# Patient Record
Sex: Male | Born: 1965 | Race: White | Hispanic: No | Marital: Married | Smoking: Current every day smoker
Health system: Southern US, Community
[De-identification: ages and names within clinical notes are randomized; demographics above are authoritative.]

## PROBLEM LIST (undated history)

## (undated) DIAGNOSIS — I509 Heart failure, unspecified: Secondary | ICD-10-CM

## (undated) DIAGNOSIS — N289 Disorder of kidney and ureter, unspecified: Secondary | ICD-10-CM

## (undated) DIAGNOSIS — F319 Bipolar disorder, unspecified: Secondary | ICD-10-CM

## (undated) DIAGNOSIS — M5030 Other cervical disc degeneration, unspecified cervical region: Secondary | ICD-10-CM

## (undated) DIAGNOSIS — K219 Gastro-esophageal reflux disease without esophagitis: Secondary | ICD-10-CM

## (undated) DIAGNOSIS — K611 Rectal abscess: Secondary | ICD-10-CM

## (undated) DIAGNOSIS — F329 Major depressive disorder, single episode, unspecified: Secondary | ICD-10-CM

## (undated) DIAGNOSIS — E7800 Pure hypercholesterolemia, unspecified: Secondary | ICD-10-CM

## (undated) DIAGNOSIS — E785 Hyperlipidemia, unspecified: Secondary | ICD-10-CM

## (undated) DIAGNOSIS — G20 Parkinson's disease: Secondary | ICD-10-CM

## (undated) DIAGNOSIS — F32A Depression, unspecified: Secondary | ICD-10-CM

## (undated) DIAGNOSIS — I10 Essential (primary) hypertension: Secondary | ICD-10-CM

## (undated) DIAGNOSIS — E119 Type 2 diabetes mellitus without complications: Secondary | ICD-10-CM

## (undated) HISTORY — DX: Rectal abscess: K61.1

## (undated) HISTORY — DX: Pure hypercholesterolemia, unspecified: E78.00

## (undated) HISTORY — DX: Gastro-esophageal reflux disease without esophagitis: K21.9

## (undated) HISTORY — DX: Bipolar disorder, unspecified: F31.9

## (undated) HISTORY — PX: NECK SURGERY: SHX720

## (undated) HISTORY — DX: Major depressive disorder, single episode, unspecified: F32.9

## (undated) HISTORY — DX: Parkinson's disease: G20

## (undated) HISTORY — DX: Other cervical disc degeneration, unspecified cervical region: M50.30

## (undated) HISTORY — DX: Depression, unspecified: F32.A

---
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
## 2001-07-09 ENCOUNTER — Inpatient Hospital Stay (HOSPITAL_COMMUNITY): Admission: AD | Admit: 2001-07-09 | Discharge: 2001-07-14 | Payer: Self-pay | Admitting: Psychiatry

## 2002-06-01 ENCOUNTER — Encounter: Payer: Self-pay | Admitting: Internal Medicine

## 2002-06-01 ENCOUNTER — Ambulatory Visit (HOSPITAL_COMMUNITY): Admission: RE | Admit: 2002-06-01 | Discharge: 2002-06-01 | Payer: Self-pay | Admitting: Internal Medicine

## 2002-07-14 ENCOUNTER — Observation Stay (HOSPITAL_COMMUNITY): Admission: AD | Admit: 2002-07-14 | Discharge: 2002-07-15 | Payer: Self-pay | Admitting: Neurosurgery

## 2002-07-14 ENCOUNTER — Encounter: Payer: Self-pay | Admitting: Neurosurgery

## 2002-08-13 ENCOUNTER — Encounter: Payer: Self-pay | Admitting: Emergency Medicine

## 2002-08-13 ENCOUNTER — Inpatient Hospital Stay (HOSPITAL_COMMUNITY): Admission: EM | Admit: 2002-08-13 | Discharge: 2002-08-19 | Payer: Self-pay | Admitting: Psychiatry

## 2003-06-05 ENCOUNTER — Emergency Department (HOSPITAL_COMMUNITY): Admission: EM | Admit: 2003-06-05 | Discharge: 2003-06-05 | Payer: Self-pay | Admitting: Emergency Medicine

## 2003-06-29 ENCOUNTER — Emergency Department (HOSPITAL_COMMUNITY): Admission: EM | Admit: 2003-06-29 | Discharge: 2003-06-29 | Payer: Self-pay | Admitting: Emergency Medicine

## 2008-08-31 DIAGNOSIS — G20A1 Parkinson's disease without dyskinesia, without mention of fluctuations: Secondary | ICD-10-CM

## 2008-08-31 DIAGNOSIS — G20 Parkinson's disease: Secondary | ICD-10-CM

## 2008-08-31 HISTORY — DX: Parkinson's disease: G20

## 2008-08-31 HISTORY — DX: Parkinson's disease without dyskinesia, without mention of fluctuations: G20.A1

## 2008-08-31 HISTORY — PX: CARPAL TUNNEL RELEASE: SHX101

## 2015-02-09 ENCOUNTER — Encounter (HOSPITAL_COMMUNITY): Payer: Self-pay | Admitting: Emergency Medicine

## 2015-02-09 ENCOUNTER — Emergency Department (HOSPITAL_COMMUNITY)
Admission: EM | Admit: 2015-02-09 | Discharge: 2015-02-09 | Disposition: A | Discharge status: Home / Self Care | Payer: Medicare Other | Attending: Emergency Medicine | Admitting: Emergency Medicine

## 2015-02-09 ENCOUNTER — Emergency Department (HOSPITAL_COMMUNITY): Payer: Medicare Other

## 2015-02-09 DIAGNOSIS — E119 Type 2 diabetes mellitus without complications: Secondary | ICD-10-CM | POA: Diagnosis not present

## 2015-02-09 DIAGNOSIS — R0600 Dyspnea, unspecified: Secondary | ICD-10-CM | POA: Insufficient documentation

## 2015-02-09 DIAGNOSIS — E785 Hyperlipidemia, unspecified: Secondary | ICD-10-CM | POA: Diagnosis not present

## 2015-02-09 DIAGNOSIS — Z720 Tobacco use: Secondary | ICD-10-CM | POA: Diagnosis not present

## 2015-02-09 DIAGNOSIS — I509 Heart failure, unspecified: Secondary | ICD-10-CM | POA: Insufficient documentation

## 2015-02-09 DIAGNOSIS — Z792 Long term (current) use of antibiotics: Secondary | ICD-10-CM | POA: Insufficient documentation

## 2015-02-09 DIAGNOSIS — Z79899 Other long term (current) drug therapy: Secondary | ICD-10-CM | POA: Insufficient documentation

## 2015-02-09 DIAGNOSIS — R600 Localized edema: Secondary | ICD-10-CM | POA: Diagnosis not present

## 2015-02-09 DIAGNOSIS — R0602 Shortness of breath: Secondary | ICD-10-CM | POA: Diagnosis present

## 2015-02-09 DIAGNOSIS — R609 Edema, unspecified: Secondary | ICD-10-CM

## 2015-02-09 DIAGNOSIS — I10 Essential (primary) hypertension: Secondary | ICD-10-CM | POA: Diagnosis not present

## 2015-02-09 HISTORY — DX: Essential (primary) hypertension: I10

## 2015-02-09 HISTORY — DX: Hyperlipidemia, unspecified: E78.5

## 2015-02-09 HISTORY — DX: Heart failure, unspecified: I50.9

## 2015-02-09 HISTORY — DX: Type 2 diabetes mellitus without complications: E11.9

## 2015-02-09 LAB — BASIC METABOLIC PANEL
Anion gap: 11 (ref 5–15)
BUN: 21 mg/dL — ABNORMAL HIGH (ref 6–20)
CO2: 32 mmol/L (ref 22–32)
Calcium: 8.3 mg/dL — ABNORMAL LOW (ref 8.9–10.3)
Chloride: 86 mmol/L — ABNORMAL LOW (ref 101–111)
Creatinine, Ser: 1.36 mg/dL — ABNORMAL HIGH (ref 0.61–1.24)
GFR calc Af Amer: >60 mL/min (ref >60)
GFR calc non Af Amer: >60 mL/min (ref >60)
Glucose, Bld: 165 mg/dL — ABNORMAL HIGH (ref 65–99)
Potassium: 3.6 mmol/L (ref 3.5–5.1)
Sodium: 129 mmol/L — ABNORMAL LOW (ref 135–145)

## 2015-02-09 LAB — CBC
HCT: 33.5 % — ABNORMAL LOW (ref 39.0–52.0)
Hemoglobin: 11.1 g/dL — ABNORMAL LOW (ref 13.0–17.0)
MCH: 34.9 pg — ABNORMAL HIGH (ref 26.0–34.0)
MCHC: 33.1 g/dL (ref 30.0–36.0)
MCV: 105.3 fL — ABNORMAL HIGH (ref 78.0–100.0)
Platelets: 176 10*3/uL (ref 150–400)
RBC: 3.18 MIL/uL — ABNORMAL LOW (ref 4.22–5.81)
RDW: 12.9 % (ref 11.5–15.5)
WBC: 5 10*3/uL (ref 4.0–10.5)

## 2015-02-09 LAB — I-STAT TROPONIN, ED: Troponin i, poc: 0 ng/mL (ref 0.00–0.08)

## 2015-02-09 LAB — BRAIN NATRIURETIC PEPTIDE: B Natriuretic Peptide: 14.3 pg/mL (ref 0.0–100.0)

## 2015-02-09 MED ORDER — IPRATROPIUM-ALBUTEROL 0.5-2.5 (3) MG/3ML IN SOLN
3.0000 mL | Freq: Once | RESPIRATORY_TRACT | Status: AC
  Administered 2015-02-09: 3 mL via RESPIRATORY_TRACT
  Filled 2015-02-09: qty 3

## 2015-02-09 MED ORDER — FUROSEMIDE 10 MG/ML IJ SOLN
80.0000 mg | Freq: Once | INTRAMUSCULAR | Status: AC
  Administered 2015-02-09: 80 mg via INTRAVENOUS
  Filled 2015-02-09: qty 8

## 2015-02-09 MED ORDER — POTASSIUM CHLORIDE CRYS ER 20 MEQ PO TBCR
60.0000 meq | EXTENDED_RELEASE_TABLET | Freq: Once | ORAL | Status: AC
  Administered 2015-02-09: 60 meq via ORAL
  Filled 2015-02-09: qty 3

## 2015-02-09 MED ORDER — IOHEXOL 350 MG/ML SOLN
100.0000 mL | Freq: Once | INTRAVENOUS | Status: AC | PRN
  Administered 2015-02-09: 100 mL via INTRAVENOUS

## 2015-02-09 MED ORDER — ALBUTEROL SULFATE HFA 108 (90 BASE) MCG/ACT IN AERS
2.0000 | INHALATION_SPRAY | Freq: Once | RESPIRATORY_TRACT | Status: AC
  Administered 2015-02-09: 2 via RESPIRATORY_TRACT
  Filled 2015-02-09: qty 6.7

## 2015-02-09 NOTE — ED Provider Notes (Signed)
CSN: [id number]     Arrival date & time 02/09/15  1903 History   First MD Initiated Contact with Patient 02/09/15 1921     Chief Complaint  Patient presents with  . Chest Pain  . Shortness of Breath     (Consider location/radiation/quality/duration/timing/severity/associated sxs/prior Treatment) HPI  49yM with dyspnea. Worsening over past couple weeks.orthopnea. Worsening LE edema. Reports recently seen by PCP and told may have CHF. Occasional non productive cough. No fever or chills. Also, L lower CP. Onset 4 days ago. Achy. Constant. Does not radiate. No appreciable exacerbating or relieving factors.    Past Medical History  Diagnosis Date  . Hypertension   . Diabetes mellitus without complication   . Hyperlipidemia   . CHF (congestive heart failure)    Past Surgical History  Procedure Laterality Date  . Neck surgery    . Testicle removal Right   . Carpal tunnel release Right    History reviewed. No pertinent family history. History  Substance Use Topics  . Smoking status: Current Every Day Smoker -- 0.50 packs/day    Types: Cigarettes  . Smokeless tobacco: Not on file  . Alcohol Use: No    Review of Systems  All systems reviewed and negative, other than as noted in HPI.   Allergies  Sulfa antibiotics  Home Medications   Prior to Admission medications   Medication Sig Start Date End Date Taking? Authorizing Provider  allopurinol (ZYLOPRIM) 300 MG tablet Take 300 mg by mouth daily at 12 noon. 12/31/14  Yes Historical Provider, MD  carbidopa-levodopa (SINEMET IR) 25-100 MG per tablet Take 2.5 tablets by mouth 3 (three) times daily. 12/31/14  Yes Historical Provider, MD  cefdinir (OMNICEF) 300 MG capsule Take 300 mg by mouth 2 (two) times daily. 02/05/15  Yes Historical Provider, MD  divalproex (DEPAKOTE ER) 500 MG 24 hr tablet Take 1 tablet by mouth 3 (three) times daily. 01/06/15  Yes Historical Provider, MD  fenofibrate (TRICOR) 145 MG tablet Take 145 mg by mouth  daily. 01/14/15  Yes Historical Provider, MD  furosemide (LASIX) 40 MG tablet Take 40 mg by mouth 2 (two) times daily. 01/31/15  Yes Historical Provider, MD  glimepiride (AMARYL) 1 MG tablet Take 1 mg by mouth every morning. 01/14/15  Yes Historical Provider, MD  levothyroxine (SYNTHROID, LEVOTHROID) 50 MCG tablet Take 50 mcg by mouth every morning. 11/29/14  Yes Historical Provider, MD  lisinopril (PRINIVIL,ZESTRIL) 20 MG tablet Take 20 mg by mouth daily. 11/29/14  Yes Historical Provider, MD  morphine (MS CONTIN) 60 MG 12 hr tablet Take 60 mg by mouth 4 (four) times daily. 01/29/15  Yes Historical Provider, MD  omega-3 acid ethyl esters (LOVAZA) 1 G capsule Take 1 capsule by mouth 4 (four) times daily. 02/01/15  Yes Historical Provider, MD  omeprazole (PRILOSEC) 40 MG capsule Take 40 mg by mouth at bedtime. 11/24/14  Yes Historical Provider, MD  Oxycodone HCl 10 MG TABS Take 10 mg by mouth daily as needed. For breakthrough pain 01/29/15  Yes Historical Provider, MD  potassium chloride (MICRO-K) 10 MEQ CR capsule Take 10 mEq by mouth 2 (two) times daily. 02/05/15  Yes Historical Provider, MD  rOPINIRole (REQUIP) 2 MG tablet Take 2 mg by mouth 3 (three) times daily. 11/29/14  Yes Historical Provider, MD  simvastatin (ZOCOR) 40 MG tablet Take 40 mg by mouth at bedtime. 11/29/14  Yes Historical Provider, MD  trihexyphenidyl (ARTANE) 2 MG tablet Take 1 mg by mouth 3 (three) times daily.  11/29/14  Yes Historical Provider, MD   BP 101/70 mmHg  Pulse 89  Temp(Src) 97.8 F (36.6 C) (Oral)  Resp 9  Ht  (1.702 m)  Wt 222 lb 1.6 oz (100.744 kg)  BMI 34.78 kg/m2  SpO2 97% Physical Exam  Constitutional: He appears well-developed and well-nourished. No distress.  HENT:  Head: Normocephalic and atraumatic.  Eyes: Conjunctivae are normal. Right eye exhibits no discharge. Left eye exhibits no discharge.  Neck: Neck supple.  Cardiovascular: Normal rate, regular rhythm and normal heart sounds.  Exam reveals no  gallop and no friction rub.   No murmur heard. Pulmonary/Chest: Effort normal and breath sounds normal. No respiratory distress. He has no wheezes. He has no rales.  Abdominal: Soft. He exhibits no distension. There is no tenderness.  Musculoskeletal: He exhibits edema. He exhibits no tenderness.  Symmetric pitting LE edema  Neurological: He is alert.  Skin: Skin is warm and dry.  Psychiatric: He has a normal mood and affect. His behavior is normal. Thought content normal.  Nursing note and vitals reviewed.   ED Course  Procedures (including critical care time) Labs Review Labs Reviewed  CBC - Abnormal; Notable for the following:    RBC 3.18 (*)    Hemoglobin 11.1 (*)    HCT 33.5 (*)    MCV 105.3 (*)    MCH 34.9 (*)    All other components within normal limits  BASIC METABOLIC PANEL - Abnormal; Notable for the following:    Sodium 129 (*)    Chloride 86 (*)    Glucose, Bld 165 (*)    BUN 21 (*)    Creatinine, Ser 1.36 (*)    Calcium 8.3 (*)    All other components within normal limits  BRAIN NATRIURETIC PEPTIDE  I-STAT TROPOININ, ED    Imaging Review Dg Chest 2 View  02/09/2015   CLINICAL DATA:  Acute onset of dyspnea and left-sided chest pain. Difficulty laying supine. Bilateral lower extremity edema. Initial encounter.  EXAM: CHEST  2 VIEW  COMPARISON:  Chest radiograph performed 02/02/2013  FINDINGS: The lungs are well-aerated and clear. There is no evidence of focal opacification, pleural effusion or pneumothorax.  The heart is normal in size; the mediastinal contour is within normal limits. No acute osseous abnormalities are seen.  IMPRESSION: No acute cardiopulmonary process seen.   Electronically Signed   By: Roanna Raider M.D.   On: 02/09/2015 20:59   Ct Angio Chest Pe W/cm &/or Wo Cm  02/09/2015   CLINICAL DATA:  Subacute onset of left lower chest pain for 2 weeks, with shortness of breath. Initial encounter.  EXAM: CT ANGIOGRAPHY CHEST WITH CONTRAST  TECHNIQUE:  Multidetector CT imaging of the chest was performed using the standard protocol during bolus administration of intravenous contrast. Multiplanar CT image reconstructions and MIPs were obtained to evaluate the vascular anatomy.  CONTRAST:  OMNIPAQUE IOHEXOL 350 MG/ML SOLN  COMPARISON:  Chest radiograph performed earlier today at 8:35 p.m.  FINDINGS: There is no evidence of significant pulmonary embolus.  Minimal left-sided peripheral atelectasis is noted. The lungs are otherwise clear. There is no evidence of significant focal consolidation, pleural effusion or pneumothorax. No masses are identified; no abnormal focal contrast enhancement is seen.  No mediastinal lymphadenopathy is seen. No pericardial effusion is identified. The great vessels are unremarkable in appearance. No axillary lymphadenopathy is seen. The thyroid gland is unremarkable in appearance.  The visualized portions of the liver and spleen are unremarkable.  No  acute osseous abnormalities are seen. Postoperative change is noted along the cervical spine.  Review of the MIP images confirms the above findings.  IMPRESSION: 1. No evidence of significant pulmonary embolus. 2. Minimal left-sided peripheral atelectasis noted. Lungs otherwise clear.   Electronically Signed   By: Roanna Raider M.D.   On: 02/09/2015 22:03     EKG Interpretation   Date/Time:  Saturday February 09 2015 19:13:24 EDT Ventricular Rate:  89 PR Interval:  124 QRS Duration: 92 QT Interval:  354 QTC Calculation: 430 R Axis:   55 Text Interpretation:  Normal sinus rhythm ST \\T \ T wave abnormality  Abnormal ECG similar to previous from 2004 Confirmed by Juleen China  MD, Alaja Goldinger  ([id number]) on 02/09/2015 9:20:57 PM      MDM   Final diagnoses:  Dyspnea        Raeford Razor, MD 02/15/15 1204

## 2015-02-09 NOTE — ED Notes (Signed)
Patient here with left lower chest pain starting about 4 days ago. Recent diagnosis of CHF by PCP on 01/29/2015. Also complaining of SOB and inability to lay flat to sleep.

## 2015-02-09 NOTE — ED Notes (Signed)
Pt stable, ambulatory, states understanding of discharge instructions 

## 2015-02-09 NOTE — Discharge Instructions (Signed)
°Peripheral Edema °You have swelling in your legs (peripheral edema). This swelling is due to excess accumulation of salt and water in your body. Edema may be a sign of heart, kidney or liver disease, or a side effect of a medication. It may also be due to problems in the leg veins. Elevating your legs and using special support stockings may be very helpful, if the cause of the swelling is due to poor venous circulation. Avoid long periods of standing, whatever the cause. °Treatment of edema depends on identifying the cause. Chips, pretzels, pickles and other salty foods should be avoided. Restricting salt in your diet is almost always needed. Water pills (diuretics) are often used to remove the excess salt and water from your body via urine. These medicines prevent the kidney from reabsorbing sodium. This increases urine flow. °Diuretic treatment may also result in lowering of potassium levels in your body. Potassium supplements may be needed if you have to use diuretics daily. Daily weights can help you keep track of your progress in clearing your edema. You should call your caregiver for follow up care as recommended. °SEEK IMMEDIATE MEDICAL CARE IF:  °· You have increased swelling, pain, redness, or heat in your legs. °· You develop shortness of breath, especially when lying down. °· You develop chest or abdominal pain, weakness, or fainting. °· You have a fever. °Document Released: 09/24/2004 Document Revised: 11/09/2011 Document Reviewed: 09/04/2009 °ExitCare® Patient Information ©2015 [employer]. This information is not intended to replace advice given to you by your health care provider. Make sure you discuss any questions you have with your health care provider. ° °Shortness of Breath °Shortness of breath means you have trouble breathing. It could also mean that you have a medical problem. You should get immediate medical care for shortness of breath. °CAUSES  °· Not enough oxygen in the air such as  with high altitudes or a smoke-filled room. °· Certain lung diseases, infections, or problems. °· Heart disease or conditions, such as angina or heart failure. °· Low red blood cells (anemia). °· Poor physical fitness, which can cause shortness of breath when you exercise. °· Chest or back injuries or stiffness. °· Being overweight. °· Smoking. °· Anxiety, which can make you feel like you are not getting enough air. °DIAGNOSIS  °Serious medical problems can often be found during your physical exam. Tests may also be done to determine why you are having shortness of breath. Tests may include: °· Chest X-rays. °· Lung function tests. °· Blood tests. °· An electrocardiogram (ECG). °· An ambulatory electrocardiogram. An ambulatory ECG records your heartbeat patterns over a 24-hour period. °· Exercise testing. °· A transthoracic echocardiogram (TTE). During echocardiography, sound waves are used to evaluate how blood flows through your heart. °· A transesophageal echocardiogram (TEE). °· Imaging scans. °Your health care provider may not be able to find a cause for your shortness of breath after your exam. In this case, it is important to have a follow-up exam with your health care provider as directed.  °TREATMENT  °Treatment for shortness of breath depends on the cause of your symptoms and can vary greatly. °HOME CARE INSTRUCTIONS  °· Do not smoke. Smoking is a common cause of shortness of breath. If you smoke, ask for help to quit. °· Avoid being around chemicals or things that may bother your breathing, such as paint fumes and dust. °· Rest as needed. Slowly resume your usual activities. °· If medicines were prescribed, take them as directed   for the full length of time directed. This includes oxygen and any inhaled medicines. °· Keep all follow-up appointments as directed by your health care provider. °SEEK MEDICAL CARE IF:  °· Your condition does not improve in the time expected. °· You have a hard time doing your  normal activities even with rest. °· You have any new symptoms. °SEEK IMMEDIATE MEDICAL CARE IF:  °· Your shortness of breath gets worse. °· You feel light-headed, faint, or develop a cough not controlled with medicines. °· You start coughing up blood. °· You have pain with breathing. °· You have chest pain or pain in your arms, shoulders, or abdomen. °· You have a fever. °· You are unable to walk up stairs or exercise the way you normally do. °MAKE SURE YOU: °· Understand these instructions. °· Will watch your condition. °· Will get help right away if you are not doing well or get worse. °Document Released: 05/12/2001 Document Revised: 08/22/2013 Document Reviewed: 11/02/2011 °ExitCare® Patient Information ©2015 [employer]. This information is not intended to replace advice given to you by your health care provider. Make sure you discuss any questions you have with your health care provider. ° ° °

## 2015-02-11 DIAGNOSIS — R600 Localized edema: Secondary | ICD-10-CM | POA: Insufficient documentation

## 2015-02-11 DIAGNOSIS — R079 Chest pain, unspecified: Secondary | ICD-10-CM | POA: Insufficient documentation

## 2015-02-12 ENCOUNTER — Ambulatory Visit: Payer: Medicare Other | Admitting: Diagnostic Neuroimaging

## 2015-03-29 ENCOUNTER — Encounter: Payer: Self-pay | Admitting: *Deleted

## 2015-04-02 ENCOUNTER — Encounter: Payer: Self-pay | Admitting: Diagnostic Neuroimaging

## 2015-04-02 ENCOUNTER — Ambulatory Visit (INDEPENDENT_AMBULATORY_CARE_PROVIDER_SITE_OTHER): Payer: Medicare Other | Admitting: Diagnostic Neuroimaging

## 2015-04-02 ENCOUNTER — Telehealth: Payer: Self-pay | Admitting: *Deleted

## 2015-04-02 VITALS — BP 121/71 | HR 80 | Ht 67.0 in | Wt 212.4 lb

## 2015-04-02 DIAGNOSIS — G251 Drug-induced tremor: Secondary | ICD-10-CM | POA: Diagnosis not present

## 2015-04-02 DIAGNOSIS — G20 Parkinson's disease: Secondary | ICD-10-CM | POA: Diagnosis not present

## 2015-04-02 DIAGNOSIS — G252 Other specified forms of tremor: Secondary | ICD-10-CM | POA: Diagnosis not present

## 2015-04-02 DIAGNOSIS — R259 Unspecified abnormal involuntary movements: Secondary | ICD-10-CM

## 2015-04-02 MED ORDER — CARBIDOPA-LEVODOPA 25-100 MG PO TABS: 3.0000 | ORAL_TABLET | Freq: Three times a day (TID) | ORAL | Status: DC

## 2015-04-02 NOTE — Telephone Encounter (Signed)
Left vm requesting Dot Lanes, patient's wife call back with name of neurologist her husband was seeing in [place] for care of his Parkinson's Disease.

## 2015-04-02 NOTE — Progress Notes (Signed)
[facility]  PATIENT: Steve Nunez DOB: 1965-11-01  REFERRING CLINICIAN: P Land  HISTORY FROM: patient  REASON FOR VISIT: new consultr    HISTORICAL  CHIEF COMPLAINT:  Chief Complaint  Patient presents with  . Tremors    rm 6, New patient  . Fall    HISTORY OF PRESENT ILLNESS:   49 year old right-handed male here for evaluation of Parkinson's disease. In 284, age 57 years old, patient developed onset of right greater than left handed tremor. Patient was living in [place] time. In 2010 he was evaluated by neurologist who diagnosed with Parkinson's disease. Apparently he had a "nuclear medicine scan" which confirmed Parkinson's disease as a diagnosis. He was started on carbidopa/levodopa which may have helped a little bit. At some point ropinirole was added. Some point later Artane was also added.  Patient has significant psychiatry history from age 35 years old, bipolar disorder, on numerous psychiatry medications through his life. He has been on Risperdal, Seroquel in the past. Currently is on Depakote, Abilify, Cymbalta.  Patient also has significant neck problems, chronic pain, neck surgery 3, currently in a narcotics pain contract with his PCP.  Patient also has significant vascular history with hypertension, diabetes, hypercholesterolemia, congestive heart failure.   REVIEW OF SYSTEMS: Full 14 system review of systems performed and notable only for insomnia snoring memory loss confusion headache difficulty swallowing dizziness tremor depression anxiety joint pain cramps aching muscles constipation impotence snoring spinning sensation trouble swallowing swelling in legs fatigue weight gain.   ALLERGIES: Allergies  Allergen Reactions  . Depakote [Divalproex Sodium] Other (See Comments)    Panic attacks  . Sulfa Antibiotics Hives    HOME MEDICATIONS: Outpatient Prescriptions Prior to Visit  Medication Sig Dispense Refill  . allopurinol  (ZYLOPRIM) 300 MG tablet Take 300 mg by mouth daily at 12 noon.  5  . carbidopa-levodopa (SINEMET IR) 25-100 MG per tablet Take 2.5 tablets by mouth 3 (three) times daily.  5  . fenofibrate (TRICOR) 145 MG tablet Take 145 mg by mouth daily.  5  . furosemide (LASIX) 40 MG tablet Take 40 mg by mouth 2 (two) times daily.  0  . glimepiride (AMARYL) 1 MG tablet Take 1 mg by mouth every morning.  5  . levothyroxine (SYNTHROID, LEVOTHROID) 50 MCG tablet Take 50 mcg by mouth every morning.  0  . lisinopril (PRINIVIL,ZESTRIL) 20 MG tablet Take 20 mg by mouth daily.  0  . morphine (MS CONTIN) 60 MG 12 hr tablet Take 60 mg by mouth 4 (four) times daily.  0  . omega-3 acid ethyl esters (LOVAZA) 1 G capsule Take 1 capsule by mouth 4 (four) times daily.  4  . omeprazole (PRILOSEC) 40 MG capsule Take 40 mg by mouth at bedtime.  2  . Oxycodone HCl 10 MG TABS Take 10 mg by mouth daily as needed. For breakthrough pain  0  . potassium chloride (MICRO-K) 10 MEQ CR capsule Take 10 mEq by mouth 2 (two) times daily.  0  . rOPINIRole (REQUIP) 2 MG tablet Take 2 mg by mouth 3 (three) times daily.  0  . simvastatin (ZOCOR) 40 MG tablet Take 40 mg by mouth at bedtime.  0  . trihexyphenidyl (ARTANE) 2 MG tablet Take 1 mg by mouth 3 (three) times daily.   0  . cefdinir (OMNICEF) 300 MG capsule Take 300 mg by mouth 2 (two) times daily.  0  . divalproex (DEPAKOTE ER) 500 MG 24 hr tablet  Take 1 tablet by mouth 3 (three) times daily.  5   No facility-administered medications prior to visit.    PAST MEDICAL HISTORY: Past Medical History  Diagnosis Date  . Hypertension   . Diabetes mellitus without complication   . Hyperlipidemia   . CHF (congestive heart failure)   . Bipolar 1 disorder   . Depression   . DDD (degenerative disc disease), cervical   . Parkinson's disease   . GERD (gastroesophageal reflux disease)   . Hypercholesterolemia   . Parkinson's disease 2010    PAST SURGICAL HISTORY: Past Surgical History    Procedure Laterality Date  . Neck surgery  2007, 2010, 2013    C3-C7 ant/post fusion, DDD  . Testicle removal Left 1981  . Carpal tunnel release Right 2010  . Bypass axilla/brachial artery Right 1981    FAMILY HISTORY: Family History  Problem Relation Age of Onset  . Hypertension Father     SOCIAL HISTORY:  History   Social History  . Marital Status: Married    Spouse Name: N/A  . Number of Children: 6  . Years of Education: GED   Occupational History  .      disabled   Social History Main Topics  . Smoking status: Current Every Day Smoker -- 0.50 packs/day for 30 years    Types: Cigarettes  . Smokeless tobacco: Not on file  . Alcohol Use: No  . Drug Use: No  . Sexual Activity: Not on file   Other Topics Concern  . Not on file   Social History Narrative   Married, lives at home with wife and family   Caffeine use - sodas 5-6 daily     PHYSICAL EXAM  GENERAL EXAM/CONSTITUTIONAL: Vitals:  Filed Vitals:   04/02/15 0834  BP: 121/71  Pulse: 80  Height: 5\' 7"  (1.702 m)  Weight: 212 lb 6.4 oz (96.344 kg)     Body mass index is 33.26 kg/(m^2).  Visual Acuity Screening   Right eye Left eye Both eyes  Without correction:     With correction: 20/40 20/40      Patient is in no distress; well developed, nourished and groomed; NECK HAS DECR ROM  CARDIOVASCULAR:  Examination of carotid arteries is normal; no carotid bruits  Regular rate and rhythm, no murmurs  Examination of peripheral vascular system by observation and palpation is normal  EYES:  Ophthalmoscopic exam of optic discs and posterior segments is normal; no papilledema or hemorrhages  MUSCULOSKELETAL:  Gait, strength, tone, movements noted in Neurologic exam below  NEUROLOGIC: MENTAL STATUS:  No flowsheet data found.  awake, alert, oriented to person, place and time  recent and remote memory intact  normal attention and concentration  language fluent, comprehension intact,  naming intact,   fund of knowledge appropriate  CRANIAL NERVE:   2nd - no papilledema on fundoscopic exam  2nd, 3rd, 4th, 6th - pupils equal and reactive to light, visual fields full to confrontation, extraocular muscles intact, no nystagmus  5th - facial sensation symmetric  7th - facial strength symmetric  8th - hearing intact  9th - palate elevates symmetrically, uvula midline  11th - shoulder shrug symmetric  12th - tongue protrusion midline  MOTOR:   normal bulk; INTERMITTENT RESTING TREMOR (R > L); POSTURAL AND ACTION TREMOR MORE PROMINENT; MILD BRADYKINESIA IN BUE AND BLE; INT REST TREMOR IN RLE; MILD COGWHEELING IN LUE; full strength in the BUE, BLE  SENSORY:   normal and symmetric to light touch,  temperature; DECR VIB IN RIGHT ARM AND BILATERAL FEET  COORDINATION:   finger-nose-finger, fine finger movements SLOW  REFLEXES:   deep tendon reflexes present and symmetric; BRISK AT KNEES  GAIT/STATION:   narrow based gait; USES CANE; GOOD ARM SWING    DIAGNOSTIC DATA (LABS, IMAGING, TESTING) - I reviewed patient records, labs, notes, testing and imaging myself where available.  Lab Results  Component Value Date   WBC 5.0 02/09/2015   HGB 11.1* 02/09/2015   HCT 33.5* 02/09/2015   MCV 105.3* 02/09/2015   PLT 176 02/09/2015      Component Value Date/Time   NA 129* 02/09/2015 1925   K 3.6 02/09/2015 1925   CL 86* 02/09/2015 1925   CO2 32 02/09/2015 1925   GLUCOSE 165* 02/09/2015 1925   BUN 21* 02/09/2015 1925   CREATININE 1.36* 02/09/2015 1925   CALCIUM 8.3* 02/09/2015 1925   GFRNONAA >60 02/09/2015 1925   GFRAA >60 02/09/2015 1925   No results found for: CHOL, HDL, LDLCALC, LDLDIRECT, TRIG, CHOLHDL No results found for: WUJW1X No results found for: VITAMINB12 No results found for: TSH  11/17/10 MRI brain [I reviewed images myself and agree with interpretation. -VRP]  - negative MRI brain - post-op changes in c-spine     ASSESSMENT AND  PLAN  49 y.o. 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 Also with history of cervical spine disease, status post surgery 3. Also with significant exposure to multiple psychiatry medications since age 4 years old including Risperdal, Seroquel, Depakote, Abilify. Patient has been diagnosed with Parkinson's disease at age 69 years old, which would be considered young onset. He has significant comorbid factors which raise possibility of drug-induced parkinsonism, cervical spine disease associated tremor, vascular parkinsonism or other causes. I would like to review prior neurology records to confirm the diagnosis. If in fact he's had a DATscan (nuclear med study) this would be quite helpful in evaluating whether patient has idiopathic Parkinson's disease versus some other cause. For now I will increase carbidopa/levodopa slightly up to 3 tablets 3 times a day. I will continue his other medications at current doses.  Regarding patient's chronic pain he is under pain contract with PCP.  Regarding patient's memory loss, this is likely due to combination of chronic pain, pain medication, chronic mood disorder and mood medication.    Ddx: drug-induced parkinsonism, medication tremor, vascular parkinsonism, idiopathic parkinson's disease  PLAN: - request outside neurology records - increase carbidopa/levodopa to 3 tabs TID - continue ropinirole 2mg  TID - continue artane 1mg  TID - follow up with PCP re: hyponatremia, anemia, renal disease  Return in about 3 months (around 07/03/2015).    Suanne Marker, MD 04/02/2015, 9:18 AM Certified in Neurology, Neurophysiology and Neuroimaging  [facility] [address] [place], [place] [address] [phone]

## 2015-04-03 NOTE — Telephone Encounter (Signed)
Release of information successfully  faxed to Dr Lyda Perone, [facility], [phone].

## 2015-07-03 ENCOUNTER — Ambulatory Visit: Payer: Medicare Other | Admitting: Diagnostic Neuroimaging

## 2015-07-04 ENCOUNTER — Encounter: Payer: Self-pay | Admitting: Diagnostic Neuroimaging

## 2015-10-04 ENCOUNTER — Other Ambulatory Visit: Payer: Self-pay | Admitting: Orthopedic Surgery

## 2015-10-04 DIAGNOSIS — M4712 Other spondylosis with myelopathy, cervical region: Secondary | ICD-10-CM

## 2015-10-10 ENCOUNTER — Ambulatory Visit
Admission: RE | Admit: 2015-10-10 | Discharge: 2015-10-10 | Disposition: A | Discharge status: Home / Self Care | Payer: Medicare Other | Source: Ambulatory Visit | Attending: Orthopedic Surgery | Admitting: Orthopedic Surgery

## 2015-10-10 DIAGNOSIS — M4712 Other spondylosis with myelopathy, cervical region: Secondary | ICD-10-CM

## 2015-10-10 MED ORDER — DIAZEPAM 5 MG PO TABS
10.0000 mg | ORAL_TABLET | Freq: Once | ORAL | Status: AC
  Administered 2015-10-10: 10 mg via ORAL

## 2015-10-10 MED ORDER — IOHEXOL 300 MG/ML  SOLN
10.0000 mL | Freq: Once | INTRAMUSCULAR | Status: AC | PRN
  Administered 2015-10-10: 10 mL via INTRATHECAL

## 2015-10-10 MED ORDER — ONDANSETRON HCL 4 MG/2ML IJ SOLN
4.0000 mg | Freq: Once | INTRAMUSCULAR | Status: AC
Start: 2015-10-10 — End: 2015-10-10
  Administered 2015-10-10: 4 mg via INTRAMUSCULAR

## 2015-10-10 MED ORDER — MEPERIDINE HCL 100 MG/ML IJ SOLN
100.0000 mg | Freq: Once | INTRAMUSCULAR | Status: AC
  Administered 2015-10-10: 100 mg via INTRAMUSCULAR

## 2015-10-10 NOTE — Progress Notes (Signed)
Pt states he has been off Abilify, Wellbutrin and Promethazine for the past 2 days.  Discharge instructions explained to the pt and his wife.

## 2015-10-10 NOTE — Discharge Instructions (Signed)
Myelogram Discharge Instructions  1. Go home and rest quietly for the next 24 hours.  It is important to lie flat for the next 24 hours.  Get up only to go to the restroom.  You may lie in the bed or on a couch on your back, your stomach, your left side or your right side.  You may have one pillow under your head.  You may have pillows between your knees while you are on your side or under your knees while you are on your back.  2. DO NOT drive today.  Recline the seat as far back as it will go, while still wearing your seat belt, on the way home.  3. You may get up to go to the bathroom as needed.  You may sit up for 10 minutes to eat.  You may resume your normal diet and medications unless otherwise indicated.  Drink lots of extra fluids today and tomorrow.  4. The incidence of headache, nausea, or vomiting is about 5% (one in 20 patients).  If you develop a headache, lie flat and drink plenty of fluids until the headache goes away.  Caffeinated beverages may be helpful.  If you develop severe nausea and vomiting or a headache that does not go away with flat bed rest, call [phone].  5. You may resume normal activities after your 24 hours of bed rest is over; however, do not exert yourself strongly or do any heavy lifting tomorrow. If when you get up you have a headache when standing, go back to bed and force fluids for another 24 hours.  6. Call your physician for a follow-up appointment.  The results of your myelogram will be sent directly to your physician by the following day.  7. If you have any questions or if complications develop after you arrive home, please call [phone].  Discharge instructions have been explained to the patient.  The patient, or the person responsible for the patient, fully understands these instructions.       May resume Abilify, Wellbutrin and Promethazine on Feb. 10, 2017, after 11:00 am.

## 2015-10-20 ENCOUNTER — Other Ambulatory Visit: Payer: Self-pay | Admitting: Diagnostic Neuroimaging

## 2015-10-21 ENCOUNTER — Other Ambulatory Visit: Payer: Self-pay | Admitting: Diagnostic Neuroimaging

## 2015-10-21 NOTE — Telephone Encounter (Signed)
LVM informing patient and wife that 30 day refill was sent to pharmacy, however patient needs to schedule FU with Dr Marjory Lies. Patient did not come to 07/2015 FU appointment. Left this caller's name, number. Advised that the phone staff may schedule FU.

## 2016-06-30 ENCOUNTER — Encounter: Payer: Self-pay | Admitting: *Deleted

## 2016-06-30 ENCOUNTER — Ambulatory Visit: Payer: Medicare Other | Admitting: Diagnostic Neuroimaging

## 2016-07-01 ENCOUNTER — Encounter: Payer: Self-pay | Admitting: Diagnostic Neuroimaging

## 2016-08-21 ENCOUNTER — Encounter (HOSPITAL_COMMUNITY): Payer: Self-pay

## 2016-08-21 DIAGNOSIS — M79662 Pain in left lower leg: Secondary | ICD-10-CM | POA: Diagnosis present

## 2016-08-21 DIAGNOSIS — G20 Parkinson's disease: Secondary | ICD-10-CM | POA: Diagnosis not present

## 2016-08-21 DIAGNOSIS — F17210 Nicotine dependence, cigarettes, uncomplicated: Secondary | ICD-10-CM | POA: Insufficient documentation

## 2016-08-21 DIAGNOSIS — I82812 Embolism and thrombosis of superficial veins of left lower extremities: Secondary | ICD-10-CM | POA: Diagnosis not present

## 2016-08-21 DIAGNOSIS — I509 Heart failure, unspecified: Secondary | ICD-10-CM | POA: Insufficient documentation

## 2016-08-21 DIAGNOSIS — I110 Hypertensive heart disease with heart failure: Secondary | ICD-10-CM | POA: Diagnosis not present

## 2016-08-21 DIAGNOSIS — Z7984 Long term (current) use of oral hypoglycemic drugs: Secondary | ICD-10-CM | POA: Insufficient documentation

## 2016-08-21 DIAGNOSIS — E119 Type 2 diabetes mellitus without complications: Secondary | ICD-10-CM | POA: Insufficient documentation

## 2016-08-21 NOTE — ED Triage Notes (Signed)
Pt complaining of knot on L ankle. Pt states moved 6 inches in last hour. Pt with small, soft lump to anterior L lower leg. Pt denies any injury or pain.

## 2016-08-22 ENCOUNTER — Emergency Department (HOSPITAL_COMMUNITY)
Admission: EM | Admit: 2016-08-22 | Discharge: 2016-08-22 | Disposition: A | Discharge status: Home / Self Care | Payer: Medicare Other | Attending: Emergency Medicine | Admitting: Emergency Medicine

## 2016-08-22 ENCOUNTER — Ambulatory Visit (HOSPITAL_BASED_OUTPATIENT_CLINIC_OR_DEPARTMENT_OTHER)
Admission: RE | Admit: 2016-08-22 | Discharge: 2016-08-22 | Disposition: A | Discharge status: Home / Self Care | Payer: Medicare Other | Source: Ambulatory Visit | Attending: Emergency Medicine | Admitting: Emergency Medicine

## 2016-08-22 DIAGNOSIS — I8002 Phlebitis and thrombophlebitis of superficial vessels of left lower extremity: Secondary | ICD-10-CM

## 2016-08-22 DIAGNOSIS — M7989 Other specified soft tissue disorders: Secondary | ICD-10-CM

## 2016-08-22 DIAGNOSIS — M79662 Pain in left lower leg: Secondary | ICD-10-CM

## 2016-08-22 NOTE — Discharge Instructions (Signed)
RETURN TOMORROW MORNING AS DIRECTED FOR A DOPPLER STUDY OF YOUR LEFT LEG. APPLY WARM COMPRESSES FOR COMFORT. ASPIRIN IS RECOMMENDED ALSO. RETURN HERE WITH SEVERE PAIN, HIGH FEVER OR NEW CONCERN.

## 2016-08-22 NOTE — ED Provider Notes (Signed)
[facility]-EMERGENCY DEPT Provider Note   CSN: [id number] Arrival date & time: 08/21/16  2331     History   Chief Complaint Chief Complaint  Patient presents with  . Leg Pain    HPI Steve Nunez is a 50 y.o. male.  Patient presents with concern for painless swelling of the left lower leg that started tonight with known injury or trauma. He first noticed a small, soft swollen area to the ankle after going to bed. When he rechecked it, the swollen area seemed to have moved caudally to the distal LE. No calf pain, redness, fever. He has a history of diabetes but no neuropathy. No history of clots.    The history is provided by the patient. No language interpreter was used.  Leg Pain      Past Medical History:  Diagnosis Date  . Bipolar 1 disorder (HCC)   . CHF (congestive heart failure) (HCC)   . DDD (degenerative disc disease), cervical   . Depression   . Diabetes mellitus without complication (HCC)   . GERD (gastroesophageal reflux disease)   . Hypercholesterolemia   . Hyperlipidemia   . Hypertension   . Parkinson's disease [facility](HCC) 2010    Patient Active Problem List   Diagnosis Date Noted  . Parkinsonism (HCC) 04/02/2015  . Chest pain 02/11/2015  . Edema leg 02/11/2015    Past Surgical History:  Procedure Laterality Date  . BYPASS AXILLA/BRACHIAL ARTERY Right 1981  . CARPAL TUNNEL RELEASE Right 2010  . NECK SURGERY  2007, 2010, 2013   C3-C7 ant/post fusion, DDD  . TESTICLE REMOVAL Left 1981       Home Medications    Prior to Admission medications   Medication Sig Start Date End Date Taking? Authorizing Provider  allopurinol (ZYLOPRIM) 300 MG tablet Take 300 mg by mouth daily at 12 noon. 12/31/14   Historical Provider, MD  ARIPiprazole (ABILIFY) 20 MG tablet Take 20 mg by mouth.    Historical Provider, MD  carbidopa-levodopa (SINEMET IR) 25-100 MG tablet TAKE 3 TABLETS BY MOUTH THREE TIMES DAILY 10/21/15   Suanne MarkerVikram R Penumalli, MD  fenofibrate (TRICOR) 145 MG  tablet Take 145 mg by mouth daily. 01/14/15   Historical Provider, MD  furosemide (LASIX) 40 MG tablet Take 40 mg by mouth 2 (two) times daily. 01/31/15   Historical Provider, MD  glimepiride (AMARYL) 1 MG tablet Take 1 mg by mouth every morning. 01/14/15   Historical Provider, MD  levothyroxine (SYNTHROID, LEVOTHROID) 50 MCG tablet Take 50 mcg by mouth every morning. 11/29/14   Historical Provider, MD  lisinopril (PRINIVIL,ZESTRIL) 20 MG tablet Take 20 mg by mouth daily. 11/29/14   Historical Provider, MD  morphine (MS CONTIN) 60 MG 12 hr tablet Take 60 mg by mouth 4 (four) times daily. 01/29/15   Historical Provider, MD  omega-3 acid ethyl esters (LOVAZA) 1 G capsule Take 1 capsule by mouth 4 (four) times daily. 02/01/15   Historical Provider, MD  omeprazole (PRILOSEC) 40 MG capsule Take 40 mg by mouth at bedtime. 11/24/14   Historical Provider, MD  Oxycodone HCl 10 MG TABS Take 10 mg by mouth daily as needed. For breakthrough pain 01/29/15   Historical Provider, MD  potassium chloride (MICRO-K) 10 MEQ CR capsule Take 10 mEq by mouth 2 (two) times daily. 02/05/15   Historical Provider, MD  rOPINIRole (REQUIP) 2 MG tablet Take 2 mg by mouth 3 (three) times daily. 11/29/14   Historical Provider, MD  simvastatin (ZOCOR) 40 MG tablet  Take 40 mg by mouth at bedtime. 11/29/14   Historical Provider, MD  trihexyphenidyl (ARTANE) 2 MG tablet Take 1 mg by mouth 3 (three) times daily.  11/29/14   Historical Provider, MD    Family History Family History  Problem Relation Age of Onset  . Hypertension Father     Social History Social History  Substance Use Topics  . Smoking status: Current Every Day Smoker    Packs/day: 0.50    Years: 30.00    Types: Cigarettes  . Smokeless tobacco: Never Used  . Alcohol use No     Allergies   Depakote [divalproex sodium] and Sulfa antibiotics   Review of Systems Review of Systems  Constitutional: Negative for chills and fever.  Respiratory: Negative.  Negative for  shortness of breath.   Cardiovascular: Negative.  Negative for chest pain.  Musculoskeletal:       See HPI  Skin: Negative.  Negative for color change and wound.     Physical Exam Updated Vital Signs BP 152/76 (BP Location: Right Arm)   Pulse 83   Temp 99 F (37.2 C) (Oral)   Resp 18   SpO2 98%   Physical Exam  Constitutional: He is oriented to person, place, and time. He appears well-developed and well-nourished.  Neck: Normal range of motion.  Cardiovascular: Intact distal pulses.   Pulmonary/Chest: Effort normal.  Musculoskeletal: Normal range of motion.  Small area of swelling isolated to anterolateral distal left LE. Non-tender. No redness, fluctuance or induration. No calf tenderness.   Neurological: He is alert and oriented to person, place, and time.  Skin: Skin is warm and dry.  Psychiatric: He has a normal mood and affect.     ED Treatments / Results  Labs (all labs ordered are listed, but only abnormal results are displayed) Labs Reviewed - No data to display  EKG  EKG Interpretation None       Radiology No results found.  Procedures Procedures (including critical care time)  Medications Ordered in ED Medications - No data to display   Initial Impression / Assessment and Plan / ED Course  I have reviewed the triage vital signs and the nursing notes.  Pertinent labs & imaging results that were available during my care of the patient were reviewed by me and considered in my medical decision making (see chart for details).  Clinical Course     Patient with non-tender swelling of LE on left with the appearance of superficial thrombophlebitis. Doubt DVT, however, patient and wife concerned so will schedule a doppler study for the morning. No lovenox felt necessary. Recommended warm compresses and aspirin.  Final Clinical Impressions(s) / ED Diagnoses   Final diagnoses:  None   1. Left LE Superficial Thrombophlebitis  New Prescriptions New  Prescriptions   No medications on file     Danne HarborShari Clevland Cork, PA-C 08/22/16 0413    Glynn OctaveStephen Rancour, MD 08/22/16 [phone]

## 2016-08-22 NOTE — Progress Notes (Signed)
VASCULAR LAB PRELIMINARY  PRELIMINARY  PRELIMINARY  PRELIMINARY  Left lower extremity venous duplex completed.    Preliminary report:  There is no DVT or SVT noted in the left lower extremity.  Yumna Ebers, RVT 08/22/2016, 8:28 AM

## 2017-07-11 ENCOUNTER — Other Ambulatory Visit: Payer: Self-pay

## 2017-07-11 ENCOUNTER — Encounter (HOSPITAL_COMMUNITY): Payer: Self-pay

## 2017-07-11 ENCOUNTER — Emergency Department (HOSPITAL_COMMUNITY)
Admission: EM | Admit: 2017-07-11 | Discharge: 2017-07-11 | Disposition: A | Discharge status: Home / Self Care | Payer: Medicare Other | Attending: Emergency Medicine | Admitting: Emergency Medicine

## 2017-07-11 DIAGNOSIS — Z7982 Long term (current) use of aspirin: Secondary | ICD-10-CM | POA: Insufficient documentation

## 2017-07-11 DIAGNOSIS — R739 Hyperglycemia, unspecified: Secondary | ICD-10-CM

## 2017-07-11 DIAGNOSIS — I129 Hypertensive chronic kidney disease with stage 1 through stage 4 chronic kidney disease, or unspecified chronic kidney disease: Secondary | ICD-10-CM | POA: Insufficient documentation

## 2017-07-11 DIAGNOSIS — G20 Parkinson's disease: Secondary | ICD-10-CM | POA: Insufficient documentation

## 2017-07-11 DIAGNOSIS — Z7984 Long term (current) use of oral hypoglycemic drugs: Secondary | ICD-10-CM | POA: Diagnosis not present

## 2017-07-11 DIAGNOSIS — E119 Type 2 diabetes mellitus without complications: Secondary | ICD-10-CM | POA: Insufficient documentation

## 2017-07-11 DIAGNOSIS — Z79899 Other long term (current) drug therapy: Secondary | ICD-10-CM | POA: Diagnosis not present

## 2017-07-11 DIAGNOSIS — N189 Chronic kidney disease, unspecified: Secondary | ICD-10-CM | POA: Insufficient documentation

## 2017-07-11 DIAGNOSIS — Z905 Acquired absence of kidney: Secondary | ICD-10-CM | POA: Insufficient documentation

## 2017-07-11 DIAGNOSIS — F17210 Nicotine dependence, cigarettes, uncomplicated: Secondary | ICD-10-CM | POA: Diagnosis not present

## 2017-07-11 HISTORY — DX: Disorder of kidney and ureter, unspecified: N28.9

## 2017-07-11 LAB — CBC
HCT: 42.3 % (ref 39.0–52.0)
HEMOGLOBIN: 15.9 g/dL (ref 13.0–17.0)
MCH: 34.6 pg — AB (ref 26.0–34.0)
MCHC: 36.3 g/dL — ABNORMAL HIGH (ref 30.0–36.0)
MCV: 95.3 fL (ref 78.0–100.0)
PLATELETS: 177 10*3/uL (ref 150–400)
RBC: 4.44 MIL/uL (ref 4.22–5.81)
RDW: 13 % (ref 11.5–15.5)
WBC: 7.2 10*3/uL (ref 4.0–10.5)

## 2017-07-11 LAB — URINALYSIS, ROUTINE W REFLEX MICROSCOPIC
Bacteria, UA: NONE SEEN
Bilirubin Urine: NEGATIVE
Hgb urine dipstick: NEGATIVE
Ketones, ur: NEGATIVE mg/dL
Leukocytes, UA: NEGATIVE
Nitrite: NEGATIVE
PH: 7 (ref 5.0–8.0)
Protein, ur: NEGATIVE mg/dL
SPECIFIC GRAVITY, URINE: 1.026 (ref 1.005–1.030)
SQUAMOUS EPITHELIAL / LPF: NONE SEEN

## 2017-07-11 LAB — CBG MONITORING, ED
Glucose-Capillary: 447 mg/dL — ABNORMAL HIGH (ref 65–99)
Glucose-Capillary: 266 mg/dL — ABNORMAL HIGH (ref 65–99)

## 2017-07-11 LAB — BASIC METABOLIC PANEL
ANION GAP: 9 (ref 5–15)
BUN: 12 mg/dL (ref 6–20)
CO2: 27 mmol/L (ref 22–32)
Calcium: 10 mg/dL (ref 8.9–10.3)
Chloride: 98 mmol/L — ABNORMAL LOW (ref 101–111)
Creatinine, Ser: 0.78 mg/dL (ref 0.61–1.24)
GFR calc non Af Amer: >60 mL/min (ref >60)
Glucose, Bld: 327 mg/dL — ABNORMAL HIGH (ref 65–99)
Potassium: 3.9 mmol/L (ref 3.5–5.1)
Sodium: 134 mmol/L — ABNORMAL LOW (ref 135–145)

## 2017-07-11 MED ORDER — INSULIN ASPART 100 UNIT/ML IV SOLN
5.0000 [IU] | Freq: Once | INTRAVENOUS | Status: AC
  Administered 2017-07-11: 5 [IU] via INTRAVENOUS

## 2017-07-11 MED ORDER — SODIUM CHLORIDE 0.9 % IV BOLUS (SEPSIS)
1000.0000 mL | Freq: Once | INTRAVENOUS | Status: AC
  Administered 2017-07-11: 1000 mL via INTRAVENOUS

## 2017-07-11 MED ORDER — INSULIN REGULAR BOLUS VIA INFUSION: 5.0000 [IU] | Freq: Once | INTRAVENOUS | Status: DC

## 2017-07-11 NOTE — ED Notes (Signed)
No answer in waiting room 

## 2017-07-11 NOTE — ED Notes (Signed)
Education  Carbs count  Magnesium for kidney protection- which foods too high, which are safe  Check CBG  Need for request of Diabetic educator to assist

## 2017-07-11 NOTE — ED Notes (Signed)
[facility]

## 2017-07-11 NOTE — ED Notes (Signed)
[facility]our[facility] rounding   Informed of 5 hour wait  Thanked for patience  assured would be seen as soon as possible 

## 2017-07-11 NOTE — ED Notes (Signed)
Pt DM2 x 1.5 years  Has one kidney  Is largly non compliant

## 2017-07-11 NOTE — ED Provider Notes (Addendum)
[facility] Provider Note   CSN: [id number] Arrival date & time: 07/11/17  1740     History   Chief Complaint Chief Complaint  Patient presents with  . Hyperglycemia    HPI Steve Nunez is a 51 y.o. male.  Patient presents with elevated glucose today.  He has a known type II diabetic and takes metformin, Amaryl, Trulicity.  He does not check his glucose on a regular basis, but he states it is normally is in the 200 range.  He has not been ill lately.  He has been admitted to the hospital twice for renal failure.  He only has one kidney.  Past medical history includes Parkinson's disease, CHF, bipolar, depression, hyperlipidemia.  He states he feels fine.      Past Medical History:  Diagnosis Date  . Bipolar 1 disorder (HCC)   . CHF (congestive heart failure) (HCC)   . DDD (degenerative disc disease), cervical   . Depression   . Diabetes mellitus without complication (HCC)   . GERD (gastroesophageal reflux disease)   . Hypercholesterolemia   . Hyperlipidemia   . Hypertension   . only has 1 kidney   . Parkinson's disease [facility](HCC) 2010    Patient Active Problem List   Diagnosis Date Noted  . Parkinsonism (HCC) 04/02/2015  . Chest pain 02/11/2015  . Edema leg 02/11/2015    Past Surgical History:  Procedure Laterality Date  . BYPASS AXILLA/BRACHIAL ARTERY Right 1981  . CARPAL TUNNEL RELEASE Right 2010  . NECK SURGERY  2007, 2010, 2013   C3-C7 ant/post fusion, DDD  . TESTICLE REMOVAL Left 1981       Home Medications    Prior to Admission medications   Medication Sig Start Date End Date Taking? Authorizing Provider  amLODipine (NORVASC) 10 MG tablet Take 10 mg every evening by mouth.  05/04/17  Yes [provider]  ARIPiprazole (ABILIFY) 20 MG tablet Take 20 mg every morning by mouth.    Yes [provider]  aspirin EC 81 MG tablet Take 81 mg every morning by mouth.   Yes [provider]  atorvastatin  (LIPITOR) 80 MG tablet Take 80 mg every morning by mouth. 06/03/17  Yes [provider]  fenofibrate (TRICOR) 145 MG tablet Take 145 mg by mouth daily. 01/14/15  Yes [provider]  glimepiride (AMARYL) 1 MG tablet Take 1 mg by mouth every morning. 01/14/15  Yes [provider]  levothyroxine (SYNTHROID, LEVOTHROID) 50 MCG tablet Take 50 mcg by mouth every morning. 11/29/14  Yes [provider]  lisinopril (PRINIVIL,ZESTRIL) 20 MG tablet Take 20 mg by mouth daily. 11/29/14  Yes [provider]  metFORMIN (GLUCOPHAGE) 500 MG tablet Take 500 mg 2 (two) times daily by mouth. 07/06/17  Yes [provider]  omega-3 acid ethyl esters (LOVAZA) 1 G capsule Take 1 capsule by mouth 4 (four) times daily. 02/01/15  Yes [provider]  omeprazole (PRILOSEC) 40 MG capsule Take 40 mg by mouth at bedtime. 11/24/14  Yes [provider]  Oxycodone HCl 10 MG TABS Take 10 mg 3 (three) times daily by mouth. For breakthrough pain 01/29/15  Yes [provider]  rOPINIRole (REQUIP) 2 MG tablet Take 2 mg by mouth 3 (three) times daily. 11/29/14  Yes [provider]  trihexyphenidyl (ARTANE) 2 MG tablet Take 1 mg by mouth 3 (three) times daily.  11/29/14  Yes [provider]  TRULICITY 1.5 MG/0.5ML SOPN Inject 1.5 mg  every Saturday into the skin. 06/03/17  Yes [provider]    Family History Family History  Problem Relation Age of Onset  . Hypertension Father     Social History Social History   Tobacco Use  . Smoking status: Current Every Day Smoker    Packs/day: 0.50    Years: 30.00    Pack years: 15.00    Types: Cigarettes  . Smokeless tobacco: Never Used  Substance Use Topics  . Alcohol use: No  . Drug use: No     Allergies   Depakote [divalproex sodium] and Sulfa antibiotics   Review of Systems Review of Systems  All other systems reviewed and are negative.    Physical Exam Updated Vital  Signs BP 130/82 (BP Location: Right Arm)   Pulse 83   Temp 98.2 F (36.8 C) (Oral)   Resp 18   Ht 5\' 7"  (1.702 m)   Wt 93.9 kg (207 lb)   SpO2 94%   BMI 32.42 kg/m   Physical Exam  Constitutional: He is oriented to person, place, and time. He appears well-developed and well-nourished.  HENT:  Head: Normocephalic and atraumatic.  Eyes: Conjunctivae are normal.  Neck: Neck supple.  Cardiovascular: Normal rate and regular rhythm.  Pulmonary/Chest: Effort normal and breath sounds normal.  Abdominal: Soft. Bowel sounds are normal.  Musculoskeletal: Normal range of motion.  Neurological: He is alert and oriented to person, place, and time.  Skin: Skin is warm and dry.  Psychiatric: He has a normal mood and affect. His behavior is normal.  Nursing note and vitals reviewed.    ED Treatments / Results  Labs (all labs ordered are listed, but only abnormal results are displayed) Labs Reviewed  BASIC METABOLIC PANEL - Abnormal; Notable for the following components:      Result Value   Sodium 134 (*)    Chloride 98 (*)    Glucose, Bld 327 (*)    All other components within normal limits  CBC - Abnormal; Notable for the following components:   MCH 34.6 (*)    MCHC 36.3 (*)    All other components within normal limits  URINALYSIS, ROUTINE W REFLEX MICROSCOPIC - Abnormal; Notable for the following components:   Color, Urine STRAW (*)    Glucose, UA >=500 (*)    All other components within normal limits  CBG MONITORING, ED - Abnormal; Notable for the following components:   Glucose-Capillary 447 (*)    All other components within normal limits  CBG MONITORING, ED - Abnormal; Notable for the following components:   Glucose-Capillary 266 (*)    All other components within normal limits  CBG MONITORING, ED  CBG MONITORING, ED    EKG  EKG Interpretation None       Radiology No results found.  Procedures Procedures (including critical care time)  Medications Ordered in  ED Medications  sodium chloride 0.9 % bolus 1,000 mL (0 mLs Intravenous Stopped 07/11/17 2100)    Followed by  sodium chloride 0.9 % bolus 1,000 mL (0 mLs Intravenous Stopped 07/11/17 2155)  insulin aspart (novoLOG) injection 5 Units (5 Units Intravenous Given 07/11/17 2055)     Initial Impression / Assessment and Plan / ED Course  I have reviewed the triage vital signs and the nursing notes.  Pertinent labs & imaging results that were available during my care of the patient were reviewed by me and considered in my medical decision making (see chart for details).     Patient  presents with elevated glucose, but no evidence of DKA.  He was given 1 L of IV fluids and 5 units of regular insulin IV.  Glucose is improved dramatically.  I recommended an increase in his metformin.  He will follow-up with his endocrinologist.  Final Clinical Impressions(s) / ED Diagnoses   Final diagnoses:  Hyperglycemia    ED Discharge Orders    None       Donnetta Hutchingook, Elsa Ploch, MD 07/11/17 [id number]    Donnetta Hutchingook, Laconya Clere, MD 07/11/17 2206

## 2017-07-11 NOTE — ED Triage Notes (Signed)
Patient reports of waking up today glucose was 393, took mediations then went to 285. States this evening glucose was 596. Complains of frequent urination and dry mouth.

## 2017-07-11 NOTE — Discharge Instructions (Signed)
Your kidney function was stable.  Try to check your glucose regularly.  I would start with increasing your metformin 2 tablets in the morning and 1 tablet in the evening.  Follow-up with your diabetes doctor.

## 2018-02-01 ENCOUNTER — Other Ambulatory Visit: Payer: Self-pay

## 2018-02-01 ENCOUNTER — Encounter (HOSPITAL_COMMUNITY): Payer: Self-pay | Admitting: Emergency Medicine

## 2018-02-01 ENCOUNTER — Emergency Department (HOSPITAL_COMMUNITY): Payer: Medicare Other

## 2018-02-01 ENCOUNTER — Emergency Department (HOSPITAL_COMMUNITY)
Admission: EM | Admit: 2018-02-01 | Discharge: 2018-02-01 | Disposition: A | Discharge status: Home / Self Care | Payer: Medicare Other | Attending: Emergency Medicine | Admitting: Emergency Medicine

## 2018-02-01 DIAGNOSIS — Z7982 Long term (current) use of aspirin: Secondary | ICD-10-CM | POA: Insufficient documentation

## 2018-02-01 DIAGNOSIS — K6289 Other specified diseases of anus and rectum: Secondary | ICD-10-CM | POA: Diagnosis present

## 2018-02-01 DIAGNOSIS — Z87891 Personal history of nicotine dependence: Secondary | ICD-10-CM | POA: Diagnosis not present

## 2018-02-01 DIAGNOSIS — I509 Heart failure, unspecified: Secondary | ICD-10-CM | POA: Diagnosis not present

## 2018-02-01 DIAGNOSIS — G20 Parkinson's disease: Secondary | ICD-10-CM | POA: Insufficient documentation

## 2018-02-01 DIAGNOSIS — K611 Rectal abscess: Secondary | ICD-10-CM | POA: Diagnosis not present

## 2018-02-01 DIAGNOSIS — Z79899 Other long term (current) drug therapy: Secondary | ICD-10-CM | POA: Insufficient documentation

## 2018-02-01 DIAGNOSIS — Z7984 Long term (current) use of oral hypoglycemic drugs: Secondary | ICD-10-CM | POA: Insufficient documentation

## 2018-02-01 DIAGNOSIS — I110 Hypertensive heart disease with heart failure: Secondary | ICD-10-CM | POA: Insufficient documentation

## 2018-02-01 DIAGNOSIS — E119 Type 2 diabetes mellitus without complications: Secondary | ICD-10-CM | POA: Diagnosis not present

## 2018-02-01 LAB — COMPREHENSIVE METABOLIC PANEL
ALT: 23 U/L (ref 17–63)
ANION GAP: 10 (ref 5–15)
AST: 28 U/L (ref 15–41)
Albumin: 3.8 g/dL (ref 3.5–5.0)
Alkaline Phosphatase: 65 U/L (ref 38–126)
BUN: 9 mg/dL (ref 6–20)
CHLORIDE: 97 mmol/L — AB (ref 101–111)
CO2: 24 mmol/L (ref 22–32)
Calcium: 8.6 mg/dL — ABNORMAL LOW (ref 8.9–10.3)
Creatinine, Ser: 0.82 mg/dL (ref 0.61–1.24)
GFR calc Af Amer: >60 mL/min (ref >60)
Glucose, Bld: 332 mg/dL — ABNORMAL HIGH (ref 65–99)
POTASSIUM: 3.9 mmol/L (ref 3.5–5.1)
Sodium: 131 mmol/L — ABNORMAL LOW (ref 135–145)
Total Bilirubin: 1.1 mg/dL (ref 0.3–1.2)
Total Protein: 7.5 g/dL (ref 6.5–8.1)

## 2018-02-01 LAB — CBC WITH DIFFERENTIAL/PLATELET
BASOS ABS: 0 10*3/uL (ref 0.0–0.1)
BASOS PCT: 0 %
EOS PCT: 2 %
Eosinophils Absolute: 0.2 10*3/uL (ref 0.0–0.7)
HCT: 41.3 % (ref 39.0–52.0)
Hemoglobin: 14.8 g/dL (ref 13.0–17.0)
LYMPHS PCT: 15 %
Lymphs Abs: 1.1 10*3/uL (ref 0.7–4.0)
MCH: 33.2 pg (ref 26.0–34.0)
MCHC: 35.8 g/dL (ref 30.0–36.0)
MCV: 92.6 fL (ref 78.0–100.0)
MONO ABS: 0.7 10*3/uL (ref 0.1–1.0)
Monocytes Relative: 9 %
Neutro Abs: 5.3 10*3/uL (ref 1.7–7.7)
Neutrophils Relative %: 74 %
PLATELETS: 158 10*3/uL (ref 150–400)
RBC: 4.46 MIL/uL (ref 4.22–5.81)
RDW: 12.6 % (ref 11.5–15.5)
WBC: 7.2 10*3/uL (ref 4.0–10.5)

## 2018-02-01 MED ORDER — CEPHALEXIN 500 MG PO CAPS: 500.0000 mg | ORAL_CAPSULE | Freq: Three times a day (TID) | ORAL | 0 refills | Status: DC

## 2018-02-01 MED ORDER — IOPAMIDOL (ISOVUE-300) INJECTION 61%
30.0000 mL | Freq: Once | INTRAVENOUS | Status: AC | PRN
  Administered 2018-02-01: 30 mL via ORAL

## 2018-02-01 MED ORDER — LIDOCAINE HCL (PF) 2 % IJ SOLN
INTRAMUSCULAR | Status: AC
  Filled 2018-02-01: qty 20

## 2018-02-01 MED ORDER — DOXYCYCLINE HYCLATE 100 MG PO CAPS: 100.0000 mg | ORAL_CAPSULE | Freq: Two times a day (BID) | ORAL | 0 refills | Status: DC

## 2018-02-01 MED ORDER — POVIDONE-IODINE 10 % OINT PACKET
TOPICAL_OINTMENT | CUTANEOUS | Status: AC
  Filled 2018-02-01: qty 2

## 2018-02-01 MED ORDER — HYDROMORPHONE HCL 1 MG/ML IJ SOLN
0.5000 mg | Freq: Once | INTRAMUSCULAR | Status: AC
  Administered 2018-02-01: 0.5 mg via INTRAVENOUS
  Filled 2018-02-01: qty 1

## 2018-02-01 MED ORDER — DOXYCYCLINE HYCLATE 100 MG PO TABS
100.0000 mg | ORAL_TABLET | Freq: Once | ORAL | Status: AC
  Administered 2018-02-01: 100 mg via ORAL
  Filled 2018-02-01: qty 1

## 2018-02-01 MED ORDER — POVIDONE-IODINE 10 % EX SOLN
CUTANEOUS | Status: AC
  Filled 2018-02-01: qty 30

## 2018-02-01 MED ORDER — IOPAMIDOL (ISOVUE-300) INJECTION 61%
100.0000 mL | Freq: Once | INTRAVENOUS | Status: AC | PRN
  Administered 2018-02-01: 100 mL via INTRAVENOUS

## 2018-02-01 MED ORDER — LORAZEPAM 1 MG PO TABS
1.0000 mg | ORAL_TABLET | Freq: Once | ORAL | Status: AC
  Administered 2018-02-01: 1 mg via ORAL
  Filled 2018-02-01: qty 1

## 2018-02-01 MED ORDER — LIDOCAINE HCL (PF) 2 % IJ SOLN
5.0000 mL | Freq: Once | INTRAMUSCULAR | Status: AC
  Administered 2018-02-01: 5 mL via INTRADERMAL

## 2018-02-01 MED ORDER — KETOROLAC TROMETHAMINE 30 MG/ML IJ SOLN
30.0000 mg | Freq: Once | INTRAMUSCULAR | Status: AC
  Administered 2018-02-01: 30 mg via INTRAVENOUS
  Filled 2018-02-01: qty 1

## 2018-02-01 NOTE — ED Notes (Signed)
Abscess to right lower buttocks area. No drainage, but redness noted. States chills and fevers at night.

## 2018-02-01 NOTE — ED Provider Notes (Signed)
[facility] EMERGENCY DEPARTMENT Provider Note   CSN: [id number] Arrival date & time: 02/01/18  1423     History   Chief Complaint Chief Complaint  Patient presents with  . Abscess    HPI Steve Nunez is a 52 y.o. male.  HPI   Steve Nunez is a 52 y.o. male with hx of DM, Parkinson's and bipolar disorder, presents to the Emergency Department complaining of pain, swelling, and redness, of the left perirectal region.  Symptoms have been present for 3 days.  He reports worsening pain and redness to the area.  He also reports increasing pain with defecation and sitting as well.  He reports low-grade fever and chills, mostly at night.  History of frequent abscesses.  He has been applying warm compresses without relief.  He denies vomiting, abdominal pain, pain or difficulty urinating, and back pain.   Past Medical History:  Diagnosis Date  . Bipolar 1 disorder (HCC)   . CHF (congestive heart failure) (HCC)   . DDD (degenerative disc disease), cervical   . Depression   . Diabetes mellitus without complication (HCC)   . GERD (gastroesophageal reflux disease)   . Hypercholesterolemia   . Hyperlipidemia   . Hypertension   . only has 1 kidney    one kidney  . Parkinson's disease [facility]) 2010    Patient Active Problem List   Diagnosis Date Noted  . Parkinsonism (HCC) 04/02/2015  . Chest pain 02/11/2015  . Edema leg 02/11/2015    Past Surgical History:  Procedure Laterality Date  . BYPASS AXILLA/BRACHIAL ARTERY Right 1981  . CARPAL TUNNEL RELEASE Right 2010  . NECK SURGERY  2007, 2010, 2013   C3-C7 ant/post fusion, DDD  . TESTICLE REMOVAL Left 1981      Home Medications    Prior to Admission medications   Medication Sig Start Date End Date Taking? Authorizing Provider  amLODipine (NORVASC) 10 MG tablet Take 10 mg every evening by mouth.  05/04/17   [provider]  ARIPiprazole (ABILIFY) 20 MG tablet Take 20 mg every morning by mouth.     [provider]  aspirin EC 81 MG tablet Take 81 mg every morning by mouth.    [provider]  atorvastatin (LIPITOR) 80 MG tablet Take 80 mg every morning by mouth. 06/03/17   [provider]  fenofibrate (TRICOR) 145 MG tablet Take 145 mg by mouth daily. 01/14/15   [provider]  glimepiride (AMARYL) 1 MG tablet Take 1 mg by mouth every morning. 01/14/15   [provider]  levothyroxine (SYNTHROID, LEVOTHROID) 50 MCG tablet Take 50 mcg by mouth every morning. 11/29/14   [provider]  lisinopril (PRINIVIL,ZESTRIL) 20 MG tablet Take 20 mg by mouth daily. 11/29/14   [provider]  metFORMIN (GLUCOPHAGE) 500 MG tablet Take 500 mg 2 (two) times daily by mouth. 07/06/17   [provider]  omega-3 acid ethyl esters (LOVAZA) 1 G capsule Take 1 capsule by mouth 4 (four) times daily. 02/01/15   [provider]  omeprazole (PRILOSEC) 40 MG capsule Take 40 mg by mouth at bedtime. 11/24/14   [provider]  Oxycodone HCl 10 MG TABS Take 10 mg 3 (three) times daily by mouth. For breakthrough pain 01/29/15   [provider]  rOPINIRole (REQUIP) 2 MG tablet Take 2 mg by mouth 3 (three) times daily. 11/29/14   [provider]  trihexyphenidyl (ARTANE) 2 MG tablet Take 1 mg by  mouth 3 (three) times daily.  11/29/14   [provider]  TRULICITY 1.5 MG/0.5ML SOPN Inject 1.5 mg every Saturday into the skin. 06/03/17   [provider]    Family History Family History  Problem Relation Age of Onset  . Hypertension Father     Social History Social History   Tobacco Use  . Smoking status: Former Smoker    Packs/day: 0.50    Years: 30.00    Pack years: 15.00    Types: Cigarettes    Last attempt to quit: 08/31/2017    Years since quitting: 0.4  . Smokeless tobacco: Never Used  Substance Use Topics  . Alcohol use: No  . Drug use: No     Allergies   Depakote [divalproex sodium] and Sulfa  antibiotics   Review of Systems Review of Systems  Constitutional: Negative for chills and fever.  Gastrointestinal: Negative for nausea and vomiting.  Musculoskeletal: Negative for arthralgias and joint swelling.  Skin: Positive for color change.       Abscess   Hematological: Negative for adenopathy.  All other systems reviewed and are negative.    Physical Exam Updated Vital Signs BP (!) 144/108   Pulse (!) 102   Temp 99 F (37.2 C) (Oral)   Resp 16   SpO2 98%   Physical Exam  Constitutional: He is oriented to person, place, and time. He appears well-developed and well-nourished. No distress.  Patient is anxious appearing  HENT:  Head: Normocephalic and atraumatic.  Mouth/Throat: Oropharynx is clear and moist.  Cardiovascular: Normal rate and regular rhythm.  Pulmonary/Chest: Effort normal and breath sounds normal. No respiratory distress.  Abdominal: Soft. He exhibits no distension. There is no tenderness. There is no guarding.  Genitourinary: Rectal exam shows tenderness.     Genitourinary Comments: Large area of erythema extending from the left perineum to the base of the testicles with central area of fluctuance near the rectum.  Neurological: He is alert and oriented to person, place, and time. He exhibits normal muscle tone. Coordination normal.  Skin: Skin is warm and dry.  Nursing note and vitals reviewed.    ED Treatments / Results  Labs (all labs ordered are listed, but only abnormal results are displayed) Labs Reviewed  COMPREHENSIVE METABOLIC PANEL - Abnormal; Notable for the following components:      Result Value   Sodium 131 (*)    Chloride 97 (*)    Glucose, Bld 332 (*)    Calcium 8.6 (*)    All other components within normal limits  CBC WITH DIFFERENTIAL/PLATELET    EKG None   Radiology No results found.  Procedures .Marland KitchenIncision and Drainage Date/Time: 02/01/2018 4:30 PM Performed by: Pauline Aus, PA-C Authorized by: Pauline Aus, PA-C   Consent:    Consent obtained:  Verbal   Consent given by:  Patient   Risks discussed:  Infection, damage to other organs and incomplete drainage Location:    Type:  Abscess   Size:  4   Location:  Anogenital   Anogenital location:  Perirectal Pre-procedure details:    Skin preparation:  Betadine Anesthesia (see MAR for exact dosages):    Anesthesia method:  Local infiltration   Local anesthetic:  Lidocaine 2% w/o epi Procedure type:    Complexity:  Simple Procedure details:    Needle aspiration: no     Incision types:  Stab incision   Incision depth:  Dermal   Scalpel blade:  11   Wound management:  Irrigated with saline   Drainage:  Purulent   Drainage amount:  Copious   Wound treatment:  Wound left open Post-procedure details:    Patient tolerance of procedure:  Tolerated well, no immediate complications   (including critical care time)    Medications Ordered in ED Medications  LORazepam (ATIVAN) tablet 1 mg (has no administration in time range)  HYDROmorphone (DILAUDID) injection 0.5 mg (has no administration in time range)  lidocaine (XYLOCAINE) 2 % injection 5 mL (has no administration in time range)     Initial Impression / Assessment and Plan / ED Course  I have reviewed the triage vital signs and the nursing notes.  Pertinent labs & imaging results that were available during my care of the patient were reviewed by me and considered in my medical decision making (see chart for details).     1730  successful I&D of perirectal abscess.  Pt feeling better.  Waiting for pt to go to CT.  End of shift, discussed pt finding and care plan with Burgess AmorJulie Idol, PA-C who agrees to arrange dispo.    Final Clinical Impressions(s) / ED Diagnoses   Final diagnoses:  Perirectal abscess    ED Discharge Orders    None       Rosey Bathriplett, Delaynee Alred, PA-C 02/01/18 1732    Mesner, Barbara CowerJason, MD 02/02/18 [phone]

## 2018-02-01 NOTE — ED Notes (Signed)
Notified CT of pt's positional IV

## 2018-02-01 NOTE — ED Triage Notes (Signed)
Abscess to left under thigh area for three days.

## 2018-02-01 NOTE — Discharge Instructions (Addendum)
Warm sitz baths 2-3 times a day.  Take the antibiotics as directed.  The packing will need to be removed in two days. You can contact Dr. Lovell SheehanJenkins office to arrange a follow-up appt if needed

## 2019-10-31 ENCOUNTER — Emergency Department (HOSPITAL_COMMUNITY)
Admission: EM | Admit: 2019-10-31 | Discharge: 2019-10-31 | Disposition: A | Discharge status: Home / Self Care | Payer: Medicare Other | Attending: Emergency Medicine | Admitting: Emergency Medicine

## 2019-10-31 ENCOUNTER — Emergency Department (HOSPITAL_COMMUNITY): Payer: Medicare Other

## 2019-10-31 ENCOUNTER — Encounter (HOSPITAL_COMMUNITY): Payer: Self-pay | Admitting: Emergency Medicine

## 2019-10-31 ENCOUNTER — Other Ambulatory Visit: Payer: Self-pay

## 2019-10-31 DIAGNOSIS — Z7984 Long term (current) use of oral hypoglycemic drugs: Secondary | ICD-10-CM | POA: Diagnosis not present

## 2019-10-31 DIAGNOSIS — I509 Heart failure, unspecified: Secondary | ICD-10-CM | POA: Diagnosis not present

## 2019-10-31 DIAGNOSIS — Z7982 Long term (current) use of aspirin: Secondary | ICD-10-CM | POA: Diagnosis not present

## 2019-10-31 DIAGNOSIS — K610 Anal abscess: Secondary | ICD-10-CM | POA: Diagnosis not present

## 2019-10-31 DIAGNOSIS — N5089 Other specified disorders of the male genital organs: Secondary | ICD-10-CM | POA: Diagnosis present

## 2019-10-31 DIAGNOSIS — E119 Type 2 diabetes mellitus without complications: Secondary | ICD-10-CM | POA: Diagnosis not present

## 2019-10-31 DIAGNOSIS — F17210 Nicotine dependence, cigarettes, uncomplicated: Secondary | ICD-10-CM | POA: Diagnosis not present

## 2019-10-31 DIAGNOSIS — Z79899 Other long term (current) drug therapy: Secondary | ICD-10-CM | POA: Insufficient documentation

## 2019-10-31 DIAGNOSIS — I110 Hypertensive heart disease with heart failure: Secondary | ICD-10-CM | POA: Diagnosis not present

## 2019-10-31 LAB — BASIC METABOLIC PANEL
Anion gap: 10 (ref 5–15)
BUN: 17 mg/dL (ref 6–20)
CO2: 26 mmol/L (ref 22–32)
Calcium: 9.3 mg/dL (ref 8.9–10.3)
Chloride: 96 mmol/L — ABNORMAL LOW (ref 98–111)
Creatinine, Ser: 0.87 mg/dL (ref 0.61–1.24)
GFR calc Af Amer: >60 mL/min (ref >60)
GFR calc non Af Amer: >60 mL/min (ref >60)
Glucose, Bld: 190 mg/dL — ABNORMAL HIGH (ref 70–99)
Potassium: 3.6 mmol/L (ref 3.5–5.1)
Sodium: 132 mmol/L — ABNORMAL LOW (ref 135–145)

## 2019-10-31 LAB — CBC WITH DIFFERENTIAL/PLATELET
Abs Immature Granulocytes: 0.04 10*3/uL (ref 0.00–0.07)
Basophils Absolute: 0 10*3/uL (ref 0.0–0.1)
Basophils Relative: 0 %
Eosinophils Absolute: 0.1 10*3/uL (ref 0.0–0.5)
Eosinophils Relative: 1 %
HCT: 43 % (ref 39.0–52.0)
Hemoglobin: 15 g/dL (ref 13.0–17.0)
Immature Granulocytes: 0 %
Lymphocytes Relative: 20 %
Lymphs Abs: 1.9 10*3/uL (ref 0.7–4.0)
MCH: 33.6 pg (ref 26.0–34.0)
MCHC: 34.9 g/dL (ref 30.0–36.0)
MCV: 96.4 fL (ref 80.0–100.0)
Monocytes Absolute: 0.7 10*3/uL (ref 0.1–1.0)
Monocytes Relative: 7 %
Neutro Abs: 6.8 10*3/uL (ref 1.7–7.7)
Neutrophils Relative %: 72 %
Platelets: 177 10*3/uL (ref 150–400)
RBC: 4.46 MIL/uL (ref 4.22–5.81)
RDW: 12.1 % (ref 11.5–15.5)
WBC: 9.6 10*3/uL (ref 4.0–10.5)
nRBC: 0 % (ref 0.0–0.2)

## 2019-10-31 MED ORDER — METRONIDAZOLE 500 MG PO TABS
500.0000 mg | ORAL_TABLET | Freq: Once | ORAL | Status: AC
  Administered 2019-10-31: 500 mg via ORAL
  Filled 2019-10-31: qty 1

## 2019-10-31 MED ORDER — HYDROMORPHONE HCL 1 MG/ML IJ SOLN
1.0000 mg | Freq: Once | INTRAMUSCULAR | Status: AC
  Administered 2019-10-31: 1 mg via INTRAVENOUS
  Filled 2019-10-31: qty 1

## 2019-10-31 MED ORDER — IOHEXOL 300 MG/ML  SOLN
100.0000 mL | Freq: Once | INTRAMUSCULAR | Status: AC | PRN
  Administered 2019-10-31: 100 mL via INTRAVENOUS

## 2019-10-31 MED ORDER — CIPROFLOXACIN HCL 500 MG PO TABS: 500.0000 mg | ORAL_TABLET | Freq: Two times a day (BID) | ORAL | 0 refills | Status: DC

## 2019-10-31 MED ORDER — OXYCODONE-ACETAMINOPHEN 5-325 MG PO TABS: 1.0000 | ORAL_TABLET | ORAL | 0 refills | Status: DC | PRN

## 2019-10-31 MED ORDER — CIPROFLOXACIN HCL 250 MG PO TABS
500.0000 mg | ORAL_TABLET | Freq: Once | ORAL | Status: AC
  Administered 2019-10-31: 500 mg via ORAL
  Filled 2019-10-31: qty 2

## 2019-10-31 MED ORDER — METRONIDAZOLE 500 MG PO TABS: 500.0000 mg | ORAL_TABLET | Freq: Two times a day (BID) | ORAL | 0 refills | Status: DC

## 2019-10-31 NOTE — ED Provider Notes (Signed)
[facility] EMERGENCY DEPARTMENT Provider Note   CSN: [id number] Arrival date & time: 10/31/19  1338     History Chief Complaint  Patient presents with  . Abscess    [name] is a 54 y.o. male.  HPI      Steve Nunez is a 54 y.o. male with past medical history of hypertension, hyperlipidemia, diabetes, and CHF. He presents to the Emergency Department complaining of pain and swelling of his left perineal area that extends to the base of his scrotum.  He states he noticed swelling to this area 4 days ago and symptoms have gradually worsened.  He reports history of an abscess to this area that was incised and drained here in June of 2019.  He states the area typically drains a small amount of fluid, but stopped draining a week ago.  he also noticed some redness to this area.  He denies fever, chills, abdominal pain, pain or difficulty with urination or defecation.   Past Medical History:  Diagnosis Date  . Bipolar 1 disorder (HCC)   . CHF (congestive heart failure) (HCC)   . DDD (degenerative disc disease), cervical   . Depression   . Diabetes mellitus without complication (HCC)   . GERD (gastroesophageal reflux disease)   . Hypercholesterolemia   . Hyperlipidemia   . Hypertension   . only has 1 kidney    one kidney  . Parkinson's disease [facility]) 2010    Patient Active Problem List   Diagnosis Date Noted  . Parkinsonism (HCC) 04/02/2015  . Chest pain 02/11/2015  . Edema leg 02/11/2015    Past Surgical History:  Procedure Laterality Date  . BYPASS AXILLA/BRACHIAL ARTERY Right 1981  . CARPAL TUNNEL RELEASE Right 2010  . NECK SURGERY  2007, 2010, 2013   C3-C7 ant/post fusion, DDD  . TESTICLE REMOVAL Left 1981       Family History  Problem Relation Age of Onset  . Hypertension Father     Social History   Tobacco Use  . Smoking status: Current Every Day Smoker    Packs/day: 0.50    Years: 30.00    Pack years: 15.00    Types: Cigarettes    Last  attempt to quit: 08/31/2017    Years since quitting: 2.1  . Smokeless tobacco: Never Used  Substance Use Topics  . Alcohol use: No  . Drug use: No    Home Medications Prior to Admission medications   Medication Sig Start Date End Date Taking? Authorizing Provider  amLODipine (NORVASC) 10 MG tablet Take 10 mg every evening by mouth.  05/04/17   [provider]  ARIPiprazole (ABILIFY) 20 MG tablet Take 20 mg every morning by mouth.     [provider]  aspirin EC 81 MG tablet Take 81 mg every morning by mouth.    [provider]  atorvastatin (LIPITOR) 80 MG tablet Take 80 mg every morning by mouth. 06/03/17   [provider]  cephALEXin (KEFLEX) 500 MG capsule Take 1 capsule (500 mg total) by mouth 3 (three) times daily. 02/01/18   Crysten Kaman, PA-C  doxycycline (VIBRAMYCIN) 100 MG capsule Take 1 capsule (100 mg total) by mouth 2 (two) times daily. 02/01/18   Linwood Gullikson, PA-C  fenofibrate (TRICOR) 145 MG tablet Take 145 mg by mouth every evening.  01/14/15   [provider]  glimepiride (AMARYL) 1 MG tablet Take 1 mg by mouth every morning. 01/14/15   [provider]  levothyroxine (SYNTHROID, LEVOTHROID) 50 MCG tablet Take 50 mcg by mouth every morning. 11/29/14   [provider]  lisinopril (PRINIVIL,ZESTRIL) 20 MG tablet Take 20 mg by mouth daily. 11/29/14   [provider]  lisinopril (PRINIVIL,ZESTRIL) 5 MG tablet Take 5 mg by mouth daily. 01/04/18   [provider]  metFORMIN (GLUCOPHAGE) 500 MG tablet Take 500-1,000 mg by mouth See admin instructions. 1000mg  in the morning and 500mg  in the evening 07/06/17   [provider]  omega-3 acid ethyl esters (LOVAZA) 1 G capsule Take 1 capsule by mouth 4 (four) times daily. 02/01/15   [provider]  omeprazole (PRILOSEC) 40 MG capsule Take 40 mg by mouth at bedtime. 11/24/14   [provider]  Oxycodone HCl 10 MG TABS Take 10 mg 3 (three) times  daily by mouth. For breakthrough pain 01/29/15   [provider]  rOPINIRole (REQUIP) 2 MG tablet Take 2 mg by mouth 3 (three) times daily. 11/29/14   [provider]  trihexyphenidyl (ARTANE) 2 MG tablet Take 1 mg by mouth 3 (three) times daily.  11/29/14   [provider]  TRULICITY 1.5 MG/0.5ML SOPN Inject 1.5 mg every Saturday into the skin. 06/03/17   [provider]    Allergies    Depakote [divalproex sodium] and Sulfa antibiotics  Review of Systems   Review of Systems  Constitutional: Negative for chills and fever.  Gastrointestinal: Negative for abdominal pain, diarrhea, nausea and vomiting.  Musculoskeletal: Negative for arthralgias and joint swelling.  Skin: Positive for color change. Negative for rash.       Pain and swelling of the left perirectal area and perineum  Hematological: Negative for adenopathy.    Physical Exam Updated Vital Signs BP (!) 143/82 (BP Location: Right Arm)   Pulse (!) 102   Temp 97.9 F (36.6 C) (Oral)   Resp 17   Ht 5\' 8"  (1.727 m)   Wt 90.7 kg   SpO2 96%   BMI 30.41 kg/m   Physical Exam Vitals and nursing note reviewed.  Constitutional:      Appearance: Normal appearance. He is not ill-appearing or toxic-appearing.  Cardiovascular:     Rate and Rhythm: Normal rate and regular rhythm.     Pulses: Normal pulses.  Pulmonary:     Effort: Pulmonary effort is normal.     Breath sounds: Normal breath sounds.  Abdominal:     General: There is no distension.     Palpations: Abdomen is soft.     Tenderness: There is no abdominal tenderness.  Genitourinary:    Testes: Normal.        Right: Mass, tenderness or swelling not present.        Left: Mass, tenderness or swelling not present.     Comments: Diffuse tenderness to left perirectal area that extends to the base of the scrotum.  Mild erythema and edema noted.  No definite area of fluctuance or induration and no drainage noted. Musculoskeletal:         General: Normal range of motion.  Skin:    General: Skin is warm.  Neurological:     General: No focal deficit present.     Mental Status: He is alert.     Motor: No weakness.     ED Results / Procedures / Treatments   Labs (all labs ordered are listed, but only abnormal results are displayed) Labs Reviewed  BASIC METABOLIC PANEL - Abnormal; Notable for the following components:  Result Value   Sodium 132 (*)    Chloride 96 (*)    Glucose, Bld 190 (*)    All other components within normal limits  CBC WITH DIFFERENTIAL/PLATELET    EKG None  Radiology CT PELVIS W CONTRAST  Result Date: 10/31/2019 CLINICAL DATA:  Recurrent left buttock and groin abscess for 3-4 days. History of abscess. EXAM: CT PELVIS WITH CONTRAST TECHNIQUE: Multidetector CT imaging of the pelvis was performed using the standard protocol following the bolus administration of intravenous contrast. CONTRAST:  OMNIPAQUE IOHEXOL 300 MG/ML  SOLN COMPARISON:  CT 02/01/2018. FINDINGS: Urinary Tract: The visualized right ureter and bladder appear normal. The left kidney and ureter are absent. Bowel: No bowel wall thickening, distention or surrounding inflammation identified within the pelvis. The appendix appears normal. Vascular/Lymphatic: Stable mildly prominent external iliac and left inguinal lymph nodes, measuring up to 15 mm in the left groin on image 40/3, likely reactive. Mild aortoiliac atherosclerosis. No acute vascular findings. Reproductive: The prostate gland appears stable. The right seminal vesicle is hypoplastic. 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 Other: There is a recurrent ring-enhancing superficial fluid collection inferomedially within the left perineum. This has enlarged compared with the previous study, measuring up to 10.1 x 2.2 cm transverse on image 59/3. This measures up to 2.9 cm in height on sagittal image 73/7. It abuts the anus and there may be a small  perianal fistula at approximately 5 o'clock. Compared with the previous CT, this extends more anteriorly into the base of the scrotum. No foreign body or soft tissue emphysema. No intrapelvic inflammatory changes or fluid collections. No ascites or free air. Musculoskeletal: No acute or worrisome osseous findings. IMPRESSION: 1. Enlarging ring-enhancing superficial fluid collection inferomedially within the left perineum, extending more anteriorly into the base of the scrotum, consistent with a recurrent perianal abscess. This abuts the anus, and there may be a small perianal fistula at approximately 5 o'clock. 2. Stable mildly prominent left external iliac and inguinal lymph nodes, likely reactive. 3. 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 4. Aortic Atherosclerosis (ICD10-I70.0). Electronically Signed   By: Carey Bullocks M.D.   On: 10/31/2019 16:13    Procedures Procedures (including critical care time)  Medications Ordered in ED Medications  HYDROmorphone (DILAUDID) injection 1 mg (1 mg Intravenous Given 10/31/19 1443)    ED Course  I have reviewed the triage vital signs and the nursing notes.  Pertinent labs & imaging results that were available during my care of the patient were reviewed by me and considered in my medical decision making (see chart for details).    MDM Rules/Calculators/A&P                       Pt with fluid collection of the perineal area w/o definite drainable abscess.  He is non-toxic appearing and labs are reassuring.  Pain improved after medication.     1630  Consulted general surgery,  Dr. Lovell Sheehan and discussed findings.  He recommended antibiotics and he would see him in the office and he also suggested pt be given info for Dr. Romie Levee, general surgery in [place].  I have discussed this with the patient and he agrees to plan.     Final Clinical Impression(s) / ED Diagnoses Final diagnoses:  Perianal abscess    Rx /  DC Orders ED Discharge Orders    None       Jimmie Rueter, PA-C  11/01/19 1638    Hayden Rasmussen, MD 11/02/19 [phone]

## 2019-10-31 NOTE — Discharge Instructions (Signed)
Warm water soaks 2-3 times a day.  I have listed two surgeons for you.  Contact one of them to arrange a follow-up appt.  Return to the ER if you develop worsening symptoms

## 2019-10-31 NOTE — ED Triage Notes (Signed)
Abscess to LT buttock and LT groin started x 3-4 days ago.  Has had abscess in the area in the past.

## 2019-10-31 NOTE — ED Notes (Signed)
Transported to CT 

## 2020-04-29 ENCOUNTER — Other Ambulatory Visit: Payer: Self-pay

## 2020-04-29 ENCOUNTER — Encounter (HOSPITAL_COMMUNITY): Payer: Self-pay | Admitting: *Deleted

## 2020-04-29 ENCOUNTER — Emergency Department (HOSPITAL_COMMUNITY): Payer: Medicare Other

## 2020-04-29 ENCOUNTER — Emergency Department (HOSPITAL_COMMUNITY)
Admission: EM | Admit: 2020-04-29 | Discharge: 2020-04-29 | Disposition: A | Discharge status: Home / Self Care | Payer: Medicare Other | Attending: Emergency Medicine | Admitting: Emergency Medicine

## 2020-04-29 DIAGNOSIS — R1011 Right upper quadrant pain: Secondary | ICD-10-CM | POA: Insufficient documentation

## 2020-04-29 DIAGNOSIS — E871 Hypo-osmolality and hyponatremia: Secondary | ICD-10-CM | POA: Insufficient documentation

## 2020-04-29 DIAGNOSIS — R55 Syncope and collapse: Secondary | ICD-10-CM | POA: Insufficient documentation

## 2020-04-29 DIAGNOSIS — Z79899 Other long term (current) drug therapy: Secondary | ICD-10-CM | POA: Insufficient documentation

## 2020-04-29 DIAGNOSIS — E860 Dehydration: Secondary | ICD-10-CM

## 2020-04-29 DIAGNOSIS — F17210 Nicotine dependence, cigarettes, uncomplicated: Secondary | ICD-10-CM | POA: Diagnosis not present

## 2020-04-29 DIAGNOSIS — R112 Nausea with vomiting, unspecified: Secondary | ICD-10-CM | POA: Insufficient documentation

## 2020-04-29 DIAGNOSIS — Z7982 Long term (current) use of aspirin: Secondary | ICD-10-CM | POA: Diagnosis not present

## 2020-04-29 DIAGNOSIS — E119 Type 2 diabetes mellitus without complications: Secondary | ICD-10-CM | POA: Diagnosis not present

## 2020-04-29 LAB — BASIC METABOLIC PANEL
Anion gap: 10 (ref 5–15)
BUN: 17 mg/dL (ref 6–20)
CO2: 27 mmol/L (ref 22–32)
Calcium: 8.8 mg/dL — ABNORMAL LOW (ref 8.9–10.3)
Chloride: 90 mmol/L — ABNORMAL LOW (ref 98–111)
Creatinine, Ser: 1.3 mg/dL — ABNORMAL HIGH (ref 0.61–1.24)
GFR calc Af Amer: >60 mL/min (ref >60)
GFR calc non Af Amer: >60 mL/min (ref >60)
Glucose, Bld: 95 mg/dL (ref 70–99)
Potassium: 4.3 mmol/L (ref 3.5–5.1)
Sodium: 127 mmol/L — ABNORMAL LOW (ref 135–145)

## 2020-04-29 LAB — COMPREHENSIVE METABOLIC PANEL
ALT: 9 U/L (ref 0–44)
AST: 25 U/L (ref 15–41)
Albumin: 4.2 g/dL (ref 3.5–5.0)
Alkaline Phosphatase: 24 U/L — ABNORMAL LOW (ref 38–126)
Anion gap: 14 (ref 5–15)
BUN: 18 mg/dL (ref 6–20)
CO2: 27 mmol/L (ref 22–32)
Calcium: 9.5 mg/dL (ref 8.9–10.3)
Chloride: 87 mmol/L — ABNORMAL LOW (ref 98–111)
Creatinine, Ser: 1.34 mg/dL — ABNORMAL HIGH (ref 0.61–1.24)
GFR calc Af Amer: >60 mL/min (ref >60)
GFR calc non Af Amer: >60 mL/min (ref >60)
Glucose, Bld: 94 mg/dL (ref 70–99)
Potassium: 3.8 mmol/L (ref 3.5–5.1)
Sodium: 128 mmol/L — ABNORMAL LOW (ref 135–145)
Total Bilirubin: 0.9 mg/dL (ref 0.3–1.2)
Total Protein: 7.3 g/dL (ref 6.5–8.1)

## 2020-04-29 LAB — LIPASE, BLOOD: Lipase: 48 U/L (ref 11–51)

## 2020-04-29 LAB — CBC WITH DIFFERENTIAL/PLATELET
Abs Immature Granulocytes: 0.03 10*3/uL (ref 0.00–0.07)
Basophils Absolute: 0 10*3/uL (ref 0.0–0.1)
Basophils Relative: 0 %
Eosinophils Absolute: 0 10*3/uL (ref 0.0–0.5)
Eosinophils Relative: 1 %
HCT: 40.3 % (ref 39.0–52.0)
Hemoglobin: 14.2 g/dL (ref 13.0–17.0)
Immature Granulocytes: 0 %
Lymphocytes Relative: 26 %
Lymphs Abs: 2.2 10*3/uL (ref 0.7–4.0)
MCH: 34.1 pg — ABNORMAL HIGH (ref 26.0–34.0)
MCHC: 35.2 g/dL (ref 30.0–36.0)
MCV: 96.6 fL (ref 80.0–100.0)
Monocytes Absolute: 0.6 10*3/uL (ref 0.1–1.0)
Monocytes Relative: 7 %
Neutro Abs: 5.3 10*3/uL (ref 1.7–7.7)
Neutrophils Relative %: 66 %
Platelets: 210 10*3/uL (ref 150–400)
RBC: 4.17 MIL/uL — ABNORMAL LOW (ref 4.22–5.81)
RDW: 11.8 % (ref 11.5–15.5)
WBC: 8.2 10*3/uL (ref 4.0–10.5)
nRBC: 0 % (ref 0.0–0.2)

## 2020-04-29 MED ORDER — SODIUM CHLORIDE 0.9 % IV BOLUS
1000.0000 mL | Freq: Once | INTRAVENOUS | Status: AC
  Administered 2020-04-29: 1000 mL via INTRAVENOUS

## 2020-04-29 MED ORDER — IOHEXOL 300 MG/ML  SOLN
100.0000 mL | Freq: Once | INTRAMUSCULAR | Status: AC | PRN
  Administered 2020-04-29: 100 mL via INTRAVENOUS

## 2020-04-29 MED ORDER — ONDANSETRON HCL 4 MG/2ML IJ SOLN
4.0000 mg | Freq: Once | INTRAMUSCULAR | Status: AC
  Administered 2020-04-29: 4 mg via INTRAVENOUS
  Filled 2020-04-29: qty 2

## 2020-04-29 MED ORDER — MORPHINE SULFATE (PF) 4 MG/ML IV SOLN
4.0000 mg | Freq: Once | INTRAVENOUS | Status: AC
  Administered 2020-04-29: 4 mg via INTRAVENOUS
  Filled 2020-04-29: qty 1

## 2020-04-29 NOTE — ED Notes (Signed)
Phelobotomy at bedside and obtained labs.

## 2020-04-29 NOTE — ED Provider Notes (Signed)
Pt signed out to me from Harlene Salts, PA-C at end of shift pending completion of work up and po challenge.    Patient was sent here by PCP for concern of dehydration and hyponatremia.  He has had right upper abdominal pain with nausea and vomiting for 2 weeks.  Today's work-up reveals hyponatremia was sodium of 128.  Mild AKI with serum creatinine of 1.34, felt to be secondary to dehydration.  Today's ultrasound shows an unremarkable gallbladder, patient was signed out pending CT of the abdomen and pelvis.  He is receiving IV normal saline and will try p.o. challenge and reeval after CT  On my exam, patient has mild tenderness of the right upper quadrant.  No guarding or rebound.  No peritoneal signs.  No exam findings to suggest acute abdomen.  He has tolerated an oral fluid challenge.  No vomiting during ER stay.  CT of the abdomen pelvis is without acute findings.  Will order another liter of IV saline and recheck BMET    CT ABDOMEN PELVIS W CONTRAST  Result Date: 04/29/2020 CLINICAL DATA:  Right upper quadrant and mid abdominal pain EXAM: CT ABDOMEN AND PELVIS WITH CONTRAST TECHNIQUE: Multidetector CT imaging of the abdomen and pelvis was performed using the standard protocol following bolus administration of intravenous contrast. CONTRAST:  OMNIPAQUE IOHEXOL 300 MG/ML  SOLN COMPARISON:  10/18/2015 FINDINGS: Lower chest: The visualized lung bases are clear bilaterally. The visualized heart and pericardium are unremarkable. Hepatobiliary: Moderate hepatic steatosis. No focal intrahepatic mass identified. No intra or extrahepatic biliary ductal dilation. The gallbladder is unremarkable. Pancreas: Unremarkable Spleen: Unremarkable Adrenals/Urinary Tract: The adrenal glands are unremarkable. 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 There is compensatory hypertrophy of the right kidney which is otherwise unremarkable. The bladder is unremarkable. Stomach/Bowel: The stomach, small bowel,  and large bowel are unremarkable. Appendix normal. No free intraperitoneal gas or fluid. Vascular/Lymphatic: No pathologic adenopathy within the abdomen and pelvis. The abdominal vasculature is age-appropriate with moderate aortoiliac atherosclerotic calcification. No aneurysm. Reproductive: Prostate is unremarkable. Other: The rectum is unremarkable. Musculoskeletal: No acute or significant osseous findings. IMPRESSION: 1. No acute intra-abdominal or intrapelvic pathology. 2. Moderate hepatic steatosis. 3. 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 Aortic Atherosclerosis (ICD10-I70.0). Electronically Signed   By: Helyn Numbers MD   On: 04/29/2020 19:45   Labs Reviewed  CBC WITH DIFFERENTIAL/PLATELET - Abnormal; Notable for the following components:      Result Value   RBC 4.17 (*)    MCH 34.1 (*)    All other components within normal limits  COMPREHENSIVE METABOLIC PANEL - Abnormal; Notable for the following components:   Sodium 128 (*)    Chloride 87 (*)    Creatinine, Ser 1.34 (*)    Alkaline Phosphatase 24 (*)    All other components within normal limits  BASIC METABOLIC PANEL - Abnormal; Notable for the following components:   Sodium 127 (*)    Chloride 90 (*)    Creatinine, Ser 1.30 (*)    Calcium 8.8 (*)    All other components within normal limits  LIPASE, BLOOD  URINALYSIS, ROUTINE W REFLEX MICROSCOPIC     Second BMET drawn prior to receiving second liter of fluids, shows slight  improvement of his creatinine (1.34 to 1.30)   2230  Pt has ate food from [employer].  No vomiting.  texting on cell phone at recheck.  Discussed findings with Dr. Estell Harpin.  It was  felt that pt can be discharged home with understanding that he will continue oral hydration at home and f/u closely with PCP this week for recheck of his labs.  Also, discussed need for GI referral for his persistent nausea, abd pain and early satiety. Pt agrees and verbalizes  understanding, return precautions discussed.        Pauline Aus, PA-C 04/29/20 2237    Bethann Berkshire, MD 04/30/20 [phone]

## 2020-04-29 NOTE — ED Provider Notes (Signed)
[facility] EMERGENCY DEPARTMENT Provider Note   CSN: [id number] Arrival date & time: 04/29/20  1425     History Chief Complaint  Patient presents with  . Near Syncope    Steve Nunez is a 54 y.o. male history of CHF, bipolar, DDD, diabetes, GERD, hypertension, hypercholesterolemia, hyperlipidemia, Parkinson's, single kidney.  Patient sent by PCP office for concern of dehydration and hyponatremia.  Patient reports he had blood work taken at PCP office 5 days ago showed hyponatremia sodium 131, at that time he also had right upper quadrant ultrasound which showed hepatic steatosis.  He reports that for the past 2 weeks he has had nausea vomiting and abdominal pain primarily in his right upper quadrant, symptoms have been constant worsening since onset.  He describes a sharp pain in his right upper quadrant radiates across his upper abdomen worsened with eating improved with rest, currently mild-moderate intensity associated with nonbloody/nonbilious emesis whenever he attempts to eat or drink.  Patient reports that over the past 2 days he has developed lightheadedness whenever he goes from a lying or sitting to a standing position.  Denies fever/chills, vision changes, neck stiffness, chest pain/shortness breath, cough/hemoptysis, diarrhea, dysuria/hematuria, extremity swelling/color change, numbness/tingling, weakness or any additional concerns.  HPI     Past Medical History:  Diagnosis Date  . Bipolar 1 disorder (HCC)   . CHF (congestive heart failure) (HCC)   . DDD (degenerative disc disease), cervical   . Depression   . Diabetes mellitus without complication (HCC)   . GERD (gastroesophageal reflux disease)   . Hypercholesterolemia   . Hyperlipidemia   . Hypertension   . only has 1 kidney    one kidney  . Parkinson's disease [facility]) 2010    Patient Active Problem List   Diagnosis Date Noted  . Parkinsonism (HCC) 04/02/2015  . Chest pain 02/11/2015  . Edema leg  02/11/2015    Past Surgical History:  Procedure Laterality Date  . BYPASS AXILLA/BRACHIAL ARTERY Right 1981  . CARPAL TUNNEL RELEASE Right 2010  . NECK SURGERY  2007, 2010, 2013   C3-C7 ant/post fusion, DDD  . TESTICLE REMOVAL Left 1981       Family History  Problem Relation Age of Onset  . Hypertension Father     Social History   Tobacco Use  . Smoking status: Current Every Day Smoker    Packs/day: 0.50    Years: 30.00    Pack years: 15.00    Types: Cigarettes    Last attempt to quit: 08/31/2017    Years since quitting: 2.6  . Smokeless tobacco: Never Used  Vaping Use  . Vaping Use: Never used  Substance Use Topics  . Alcohol use: No  . Drug use: No    Home Medications Prior to Admission medications   Medication Sig Start Date End Date Taking? Authorizing Provider  acarbose (PRECOSE) 50 MG tablet Take 50 mg by mouth in the morning, at noon, and at bedtime. 10/10/19   [provider]  amLODipine (NORVASC) 10 MG tablet Take 10 mg every evening by mouth.  05/04/17   [provider]  ARIPiprazole (ABILIFY) 20 MG tablet Take 20 mg every morning by mouth.     [provider]  ARIPiprazole (ABILIFY) 30 MG tablet Take 30 mg by mouth daily. 06/13/19   [provider]  aspirin EC 81 MG tablet Take 81 mg every morning by mouth.    [provider]  atorvastatin (LIPITOR) 80 MG tablet Take 80  mg every morning by mouth. 06/03/17   [provider]  carbidopa-levodopa (SINEMET IR) 25-100 MG tablet Take 2.5 tablets by mouth in the morning, at noon, and at bedtime. 10/10/19   [provider]  cephALEXin (KEFLEX) 500 MG capsule Take 1 capsule (500 mg total) by mouth 3 (three) times daily. 02/01/18   Triplett, Tammy, PA-C  ciprofloxacin (CIPRO) 500 MG tablet Take 1 tablet (500 mg total) by mouth 2 (two) times daily. 10/31/19   Triplett, Tammy, PA-C  doxycycline (VIBRAMYCIN) 100 MG capsule Take 1 capsule (100 mg total) by mouth 2 (two)  times daily. 02/01/18   Triplett, Tammy, PA-C  fenofibrate (TRICOR) 145 MG tablet Take 145 mg by mouth every evening.  01/14/15   [provider]  glimepiride (AMARYL) 1 MG tablet Take 1 mg by mouth every morning. 01/14/15   [provider]  hydrochlorothiazide (HYDRODIURIL) 25 MG tablet Take 25 mg by mouth daily. 10/18/19   [provider]  levothyroxine (SYNTHROID, LEVOTHROID) 50 MCG tablet Take 50 mcg by mouth every morning. 11/29/14   [provider]  lisinopril (PRINIVIL,ZESTRIL) 20 MG tablet Take 20 mg by mouth daily. 11/29/14   [provider]  lisinopril (PRINIVIL,ZESTRIL) 5 MG tablet Take 5 mg by mouth daily. 01/04/18   [provider]  metFORMIN (GLUCOPHAGE) 500 MG tablet Take 500-1,000 mg by mouth See admin instructions. 1000mg  in the morning and 500mg  in the evening 07/06/17   [provider]  metroNIDAZOLE (FLAGYL) 500 MG tablet Take 1 tablet (500 mg total) by mouth 2 (two) times daily. 10/31/19   Triplett, Tammy, PA-C  omega-3 acid ethyl esters (LOVAZA) 1 G capsule Take 1 capsule by mouth 4 (four) times daily. 02/01/15   [provider]  omeprazole (PRILOSEC) 40 MG capsule Take 40 mg by mouth at bedtime. 11/24/14   [provider]  Oxycodone HCl 10 MG TABS Take 10 mg 3 (three) times daily by mouth. For breakthrough pain 01/29/15   [provider]  oxyCODONE-acetaminophen (PERCOCET/ROXICET) 5-325 MG tablet Take 1 tablet by mouth every 4 (four) hours as needed. 10/31/19   Triplett, Tammy, PA-C  rOPINIRole (REQUIP) 2 MG tablet Take 2 mg by mouth 3 (three) times daily. 11/29/14   [provider]  trihexyphenidyl (ARTANE) 2 MG tablet Take 1 mg by mouth 3 (three) times daily.  11/29/14   [provider]  TRULICITY 1.5 MG/0.5ML SOPN Inject 1.5 mg every Saturday into the skin. 06/03/17   [provider]    Allergies    Depakote [divalproex sodium] and Sulfa antibiotics  Review of Systems     Review of Systems Ten systems are reviewed and are negative for acute change except as noted in the HPI  Physical Exam Updated Vital Signs BP 121/68   Pulse 79   Temp 98.8 F (37.1 C)   Resp 20   SpO2 97%   Physical Exam Constitutional:      General: He is not in acute distress.    Appearance: Normal appearance. He is well-developed. He is not ill-appearing or diaphoretic.  HENT:     Head: Normocephalic and atraumatic.  Eyes:     General: Vision grossly intact. Gaze aligned appropriately.     Pupils: Pupils are equal, round, and reactive to light.  Neck:     Trachea: Trachea and phonation normal.  Pulmonary:     Effort: Pulmonary effort is normal. No respiratory distress.  Abdominal:     General: There is no distension.  Palpations: Abdomen is soft.     Tenderness: There is abdominal tenderness in the right upper quadrant. There is no guarding or rebound. Positive signs include Murphy's sign. Negative signs include McBurney's sign.  Musculoskeletal:        General: Normal range of motion.     Cervical back: Normal range of motion.  Skin:    General: Skin is warm and dry.  Neurological:     Mental Status: He is alert.     GCS: GCS eye subscore is 4. GCS verbal subscore is 5. GCS motor subscore is 6.     Comments: Speech is clear and goal oriented, follows commands Major Cranial nerves without deficit, no facial droop Normal strength in upper and lower extremities bilaterally including dorsiflexion and plantar flexion, strong and equal grip strength Sensation normal to light and sharp touch Moves extremities without ataxia, coordination intact Normal finger to nose and rapid alternating movements Neg romberg, no pronator drift Normal gait  Psychiatric:        Behavior: Behavior normal.     ED Results / Procedures / Treatments   Labs (all labs ordered are listed, but only abnormal results are displayed) Labs Reviewed - No data to  display  EKG None  Radiology No results found.  Procedures Procedures (including critical care time)  Medications Ordered in ED Medications - No data to display  ED Course  I have reviewed the triage vital signs and the nursing notes.  Pertinent labs & imaging results that were available during my care of the patient were reviewed by me and considered in my medical decision making (see chart for details).    MDM Rules/Calculators/A&P                          Additional history obtained from: 1. Nursing notes from this visit. 2. Paperwork brought by patient's PCP. -------------------- I ordered, reviewed and interpreted labs which include: Lipase within normal meds, doubt pancreatitis. CMP shows worsening hyponatremia 128, elevated creatinine 1.34 above baseline at 0.8, no emergent LFT elevations or gap. CBC without leukocytosis or anemia. Urinalysis pending.  Right upper quadrant ultrasound:  IMPRESSION:  Probable fatty infiltration of liver as above.    Nonvisualization of CBD.    Unremarkable gallbladder.   Patient reassessed resting comfortably no acute distress still has mild abdominal pain.  Pain nausea medication has been ordered.  Patient received 1 L fluid bolus.  Of note he denies history of heart failure reports normal echocardiogram few years ago.  Unavailable in chart.  Given patient's persistent symptoms feel he would benefit from further imaging. - Care handoff given to [facility], PA-C at shift change.  Plan of care is to follow-up on CT abdomen pelvis, disposition per oncoming team.  Note: Portions of this report may have been transcribed using voice recognition software. Every effort was made to ensure accuracy; however, inadvertent computerized transcription errors may still be present. Final Clinical Impression(s) / ED Diagnoses Final diagnoses:  None    Rx / DC Orders ED Discharge Orders    None       Elizabeth Palau 04/29/20 Charolette Child, MD 04/29/20 2039

## 2020-04-29 NOTE — Discharge Instructions (Addendum)
The CT of your abdomen today did not show evidence for concerning process.  Your blood work shows you are likely dehydrated.  As we discussed, it is important that you drink plenty of water and Gatorade.  You will need to have your kidney functions rechecked later this week.  Also, I have provided referral information for gastroenterology, Dr. Luvenia Starch office.  Call to arrange a follow-up appointment regarding your abdominal pain as you may possibly need an upper endoscopy.  Return to the emergency department if you develop any worsening symptoms.

## 2020-04-29 NOTE — ED Notes (Signed)
Pt in ultrasound at this time

## 2020-04-29 NOTE — ED Triage Notes (Signed)
States he has been dizzy and lightheaded for the past 2 days, states he was advised by his PCP to come in for evalaution

## 2020-04-29 NOTE — ED Notes (Signed)
PA at bedside.

## 2020-04-29 NOTE — ED Notes (Signed)
Pt taken to CT at this time.

## 2020-05-07 ENCOUNTER — Telehealth: Payer: Self-pay

## 2020-05-07 NOTE — Telephone Encounter (Signed)
Pt was seen in the ER. Can we accept him as a new patient? [phone]

## 2020-05-07 NOTE — Telephone Encounter (Signed)
Ok to schedule ov.  

## 2020-05-08 NOTE — Telephone Encounter (Signed)
Pt's wife is aware of OV on Thursday.

## 2020-05-08 NOTE — Telephone Encounter (Signed)
LMOM for patient to return my call to schedule OV

## 2020-05-09 ENCOUNTER — Encounter: Payer: Self-pay | Admitting: Nurse Practitioner

## 2020-05-09 ENCOUNTER — Encounter: Payer: Self-pay | Admitting: *Deleted

## 2020-05-09 ENCOUNTER — Ambulatory Visit (INDEPENDENT_AMBULATORY_CARE_PROVIDER_SITE_OTHER): Payer: Medicare Other | Admitting: Nurse Practitioner

## 2020-05-09 ENCOUNTER — Other Ambulatory Visit: Payer: Self-pay

## 2020-05-09 ENCOUNTER — Telehealth: Payer: Self-pay

## 2020-05-09 DIAGNOSIS — K219 Gastro-esophageal reflux disease without esophagitis: Secondary | ICD-10-CM

## 2020-05-09 DIAGNOSIS — R109 Unspecified abdominal pain: Secondary | ICD-10-CM | POA: Insufficient documentation

## 2020-05-09 DIAGNOSIS — R112 Nausea with vomiting, unspecified: Secondary | ICD-10-CM

## 2020-05-09 DIAGNOSIS — R1084 Generalized abdominal pain: Secondary | ICD-10-CM

## 2020-05-09 MED ORDER — PANTOPRAZOLE SODIUM 40 MG PO TBEC: 40.0000 mg | DELAYED_RELEASE_TABLET | Freq: Two times a day (BID) | ORAL | 3 refills | Status: DC

## 2020-05-09 NOTE — Patient Instructions (Signed)
Your health issues we discussed today were:   Abdominal pain and nausea/vomiting with worsening GERD (reflux/heartburn): 1. Have your breath test completed as soon as you can, preferably today 2. When you are having your blood test completed, have your other labs completed as well 3. When you finished your breath test you can start taking Protonix 40 mg.  Take this pursing in the morning and 30 minutes before your last meal the day.  I sent a prescription to your pharmacy 4. When you start Protonix, stop famotidine (Pepcid) 5. Call if you need any more nausea medication 6. Continue to push fluids to try to prevent dehydration 7. We will schedule an upper endoscopy for you 8. Further recommendations will follow your endoscopy  Overall I recommend:  1. Continue your other current medications 2. Return for follow-up in 6 weeks 3. Call if you have any questions or concerns   At [facility] we value your feedback. You may receive a survey about your visit today. Please share your experience as we strive to create trusting relationships with our patients to provide genuine, compassionate, quality care.  We appreciate your understanding and patience as we review any laboratory studies, imaging, and other diagnostic tests that are ordered as we care for you. Our office policy is 5 business days for review of these results, and any emergent or urgent results are addressed in a timely manner for your best interest. If you do not hear from our office in 1 week, please contact us.   We also encourage the use of MyChart, which contains your medical information for your review as well. If you are not enrolled in this feature, an access code is on this after visit summary for your convenience. Thank you for allowing [place] to be involved in your care.  It was great to see you today!  I hope you have a great rest of your summer!!

## 2020-05-09 NOTE — Telephone Encounter (Signed)
Steve Nunez from the lab called. Pt went to [facility] to have labs and H. Pylori Breath test. Pt won't be able to complete the breath test until 2 weeks since he takes Famotidine.

## 2020-05-09 NOTE — Progress Notes (Signed)
Primary Care Physician:  Steve Jump, NP Primary Gastroenterologist:  Dr. Abbey Chatters  Chief Complaint  Patient presents with  . Abdominal Pain    generalized pain, no sharp, all over, constant, worse with eating or drinking certain liquids.    HPI:   Steve Nunez is a 54 y.o. male who presents on referral from the emergency department.  The patient was in the ER 04/29/2020 when he presented for dehydration and hyponatremia with right upper quadrant pain and nausea and vomiting for 2 weeks.  Noted hyponatremia with sodium of 128, mild AKI with creatinine of 1.34 likely due to dehydration.  Ultrasound unremarkable gallbladder.  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  Patient received fluids and second BMP showed improvement in creatinine from 1.34-1.30.  Patient ate food in the ER without vomiting, felt stable for discharge.  Recommended GI evaluation for persistent nausea, abdominal pain, early satiety.  No previous GI evaluation in our system.  No previous colonoscopy or endoscopy in our system.  Today he states he is doing okay overall. Feels his symptoms are worse. Pain is near constant, generalized, described as dull, crampy. Pain seems to worsen after eating. Also tends to get nauseated after eating, even from first bite. Still with vomiting. Has been about 15 days since he's had good oral intake. He is pushing fluids to prevent dehydration. Has given up sodas which caused severe symptoms. Has chronic GERD symptoms, is on Pepcid (was previously on omeprazole but insurance noted he had to try/fail famotidine first). He did have good GERD control on omeprazole. His current symptoms did start before switch to Pepcid, but the switch hasn't helped. Currently having daily, multiple episodes of reflux/GERD symptoms. He is  concerned about kidnye damage as he only has one kidney. Denies hematochezia, melena, fever, chills. Has had some subjective weight loss with limited intake. Does have dark stools on Pepto Bismol. Denies diarrhea. Denies URI or flu-like symptoms. Denies loss of sense of taste or smell. The patient has received COVID-19 vaccination(s). Denies chest pain, dyspnea, dizziness, lightheadedness, syncope, near syncope. Denies any other upper or lower GI symptoms.  PMH lists CHF, patient states he doesn't have heart failure but he wore a monitor for several days and had "a flutter" of some sort. BNP in 2016 normal. DM with typical CBG 180. Thinks his last a1c was about 8.  Past Medical History:  Diagnosis Date  . Bipolar 1 disorder ([place])   . CHF (congestive heart failure) ([place])   . DDD (degenerative disc disease), cervical   . Depression   . Diabetes mellitus without complication ([place])   . GERD (gastroesophageal reflux disease)   . Hypercholesterolemia   . Hyperlipidemia   . Hypertension   . only has 1 kidney    one kidney  . Parkinson's disease ([place]) 2010    Past Surgical History:  Procedure Laterality Date  . BYPASS AXILLA/BRACHIAL ARTERY Right 1981  . CARPAL TUNNEL RELEASE Right 2010  . NECK SURGERY  2007, 2010, 2013   C3-C7 ant/post fusion, DDD  . TESTICLE REMOVAL Left 1981    Current Outpatient Medications  Medication Sig Dispense Refill  . acarbose (PRECOSE) 50 MG tablet Take 50 mg by mouth in the morning, at noon, and at bedtime.    Marland Kitchen amLODipine (NORVASC) 10 MG tablet Take 10 mg every evening by mouth.  0  . ARIPiprazole (ABILIFY) 30 MG tablet Take 30 mg by mouth daily.    Marland Kitchen atorvastatin (LIPITOR) 80 MG tablet Take 80 mg every morning by mouth.  0  . carbidopa-levodopa (SINEMET IR) 25-100 MG tablet Take 2.5 tablets by mouth in the morning, at noon, and at bedtime. 2 and one-half tablets taken three times daily    . famotidine (PEPCID) 40 MG tablet Take 40 mg by mouth daily.    .  fenofibrate (TRICOR) 145 MG tablet Take 145 mg by mouth every evening.   5  . glimepiride (AMARYL) 1 MG tablet Take 1 mg by mouth every morning.  5  . hydrochlorothiazide (HYDRODIURIL) 25 MG tablet Take 25 mg by mouth daily.    Marland Kitchen levothyroxine (SYNTHROID, LEVOTHROID) 50 MCG tablet Take 50 mcg by mouth every morning.  0  . lisinopril (PRINIVIL,ZESTRIL) 20 MG tablet Take 20 mg by mouth in the morning.   0  . lisinopril (PRINIVIL,ZESTRIL) 5 MG tablet Take 5 mg by mouth in the morning.   0  . metFORMIN (GLUCOPHAGE) 500 MG tablet Take 1,000 mg by mouth 2 (two) times daily with a meal.   0  . omega-3 acid ethyl esters (LOVAZA) 1 G capsule Take 1 capsule by mouth 4 (four) times daily.  4  . ondansetron (ZOFRAN-ODT) 4 MG disintegrating tablet Take 4 mg by mouth 2 (two) times daily as needed.    Marland Kitchen oxyCODONE (OXY IR/ROXICODONE) 5 MG immediate release tablet Take 10 mg by mouth in the morning, at noon, and at bedtime.    Marland Kitchen rOPINIRole (REQUIP) 2 MG tablet Take 2 mg by mouth 3 (three) times daily.  0  . trihexyphenidyl (ARTANE) 2 MG tablet Take 1 mg by mouth 3 (three) times daily.   0  . valACYclovir (VALTREX) 1000 MG tablet Take 1,000 mg by mouth 3 (three) times daily. 7 day course starting on 04/23/2020     No current facility-administered medications for this visit.    Allergies as of 05/09/2020 - Review Complete 05/09/2020  Allergen Reaction Noted  . Depakote [divalproex sodium] Other (See Comments) 04/02/2015  . Sulfa antibiotics Hives 02/09/2015    Family History  Problem Relation Age of Onset  . Hypertension Father     Social History   Socioeconomic History  . Marital status: Married    Spouse name: Not on file  . Number of children: 6  . Years of education: GED  . Highest education level: Not on file  Occupational History    Comment: disabled  Tobacco Use  . Smoking status: Current Every Day Smoker    Packs/day: 0.50    Years: 30.00    Pack years: 15.00    Types: Cigarettes     Last attempt to quit: 08/31/2017    Years since quitting: 2.6  . Smokeless tobacco: Never Used  Vaping Use  . Vaping Use: Never used  Substance and Sexual Activity  . Alcohol use: No  . Drug use: No  . Sexual activity: Not on file  Other Topics Concern  . Not on file  Social History Narrative   Married, lives at home with wife and family   Caffeine use - sodas 5-6 daily   Social Determinants of Health   Financial Resource Strain:   . Difficulty of Paying Living Expenses: Not on file  Food Insecurity:   . Worried About Charity fundraiser in the Last Year: Not on file  . Ran Out of Food in the  Last Year: Not on file  Transportation Needs:   . Lack of Transportation (Medical): Not on file  . Lack of Transportation (Non-Medical): Not on file  Physical Activity:   . Days of Exercise per Week: Not on file  . Minutes of Exercise per Session: Not on file  Stress:   . Feeling of Stress : Not on file  Social Connections:   . Frequency of Communication with Friends and Family: Not on file  . Frequency of Social Gatherings with Friends and Family: Not on file  . Attends Religious Services: Not on file  . Active Member of Clubs or Organizations: Not on file  . Attends Archivist Meetings: Not on file  . Marital Status: Not on file  Intimate Partner Violence:   . Fear of Current or Ex-Partner: Not on file  . Emotionally Abused: Not on file  . Physically Abused: Not on file  . Sexually Abused: Not on file    Subjective: Review of Systems  Constitutional: Negative for chills, fever, malaise/fatigue and weight loss.  HENT: Negative for congestion and sore throat.   Respiratory: Negative for cough and shortness of breath.   Cardiovascular: Negative for chest pain and palpitations.  Gastrointestinal: Positive for abdominal pain, heartburn, nausea and vomiting. Negative for blood in stool, diarrhea and melena.  Musculoskeletal: Negative for joint pain and myalgias.  Skin:  Negative for rash.  Neurological: Negative for dizziness and weakness.  Endo/Heme/Allergies: Does not bruise/bleed easily.  Psychiatric/Behavioral: Negative for depression. The patient is not nervous/anxious.   All other systems reviewed and are negative.      Objective: BP 125/77   Pulse 63   Temp (!) 97.2 F (36.2 C) (Oral)   Ht _0  (1.702 m)   Wt 190 lb 6.4 oz (86.4 kg)   BMI 29.82 kg/m  Physical Exam Vitals and nursing note reviewed.  Constitutional:      General: He is not in acute distress.    Appearance: Normal appearance. He is not ill-appearing, toxic-appearing or diaphoretic.  HENT:     Head: Normocephalic and atraumatic.     Nose: No congestion or rhinorrhea.  Eyes:     General: No scleral icterus. Cardiovascular:     Rate and Rhythm: Normal rate and regular rhythm.     Heart sounds: Normal heart sounds.  Pulmonary:     Effort: Pulmonary effort is normal.     Breath sounds: Normal breath sounds.  Abdominal:     General: Bowel sounds are normal. There is no distension.     Palpations: Abdomen is soft. There is no hepatomegaly, splenomegaly or mass.     Tenderness: There is generalized abdominal tenderness. There is no guarding or rebound.     Hernia: No hernia is present.     Comments: Pain worst in the upper abdomen  Musculoskeletal:     Cervical back: Neck supple.  Skin:    General: Skin is warm and dry.     Coloration: Skin is not jaundiced.     Findings: No bruising or rash.  Neurological:     General: No focal deficit present.     Mental Status: He is alert and oriented to person, place, and time. Mental status is at baseline.  Psychiatric:        Mood and Affect: Mood normal.        Behavior: Behavior normal.        Thought Content: Thought content normal.      Assessment:  Very pleasant 54 year old male who presents on referral from emergency department for significant abdominal pain, nausea/vomiting, also worsening GERD.  Her abdominal  pain is generalized but generally worse in the upper abdomen.  He was recently switched from omeprazole to famotidine.  His symptoms did begin prior to this switch however.  His abdominal pain does worsen with eating which is generally what triggers his nausea and vomiting as well.  His nausea and vomiting is persistent.  He has not had much oral intake in the past 2 weeks.  He only has 1 kidney and is at risk for kidney dysfunction and has been trying to push fluids.  His GERD symptoms have also been flared up quite significantly in the past 2 weeks.  GERD: Stop famotidine and start Protonix 40 mg twice daily.  Worsening GERD, especially if H. pylori or gastritis, esophagitis could result in the symptoms she is experiencing  Abdominal pain with nausea/vomiting: I will check an H. pylori breath test for H. pylori.  Update labs including CBC and CMP to check for kidney function and any sign of infection.  Differentials include esophagitis, gastritis, duodenitis, H. pylori, peptic ulcer disease.  CT and ultrasound unremarkable in the emergency room.  Hopefully Protonix will provide him with some relief.  We will plan for an EGD to further evaluate as well and he would likely benefit from an H. pylori biopsy at that time.  Proceed with EGD with Dr. Abbey Chatters on propofol/MAC in near future: the risks, benefits, and alternatives have been discussed with the patient in detail. The patient states understanding and desires to proceed.  ASA II (DM)  Plan: 1. CBC, CMP, H. pylori breath test 2. Stop famotidine 3. Start Protonix 40 mg twice daily after completing breath test 4. Call if further nausea meds needed 5. Push fluids to prevent dehydration 6. Call for any worsening symptoms especially dizziness, lightheadedness, syncope. 7. EGD with H. pylori biopsy any imminent future 8. Follow-up in 6 months    Thank you for allowing [place] to participate in the care of [place], DNP,  AGNP-C Adult & Gerontological Nurse Practitioner [facility]   05/09/2020 2:16 PM   Disclaimer: This note was dictated with voice recognition software. Similar sounding words can inadvertently be transcribed and may not be corrected upon review.

## 2020-05-09 NOTE — H&P (View-Only) (Signed)
Primary Care Physician:  Darrol Jump, NP Primary Gastroenterologist:  Dr. Abbey Chatters  Chief Complaint  Patient presents with  . Abdominal Pain    generalized pain, no sharp, all over, constant, worse with eating or drinking certain liquids.    HPI:   Steve Nunez is a 54 y.o. male who presents on referral from the emergency department.  The patient was in the ER 04/29/2020 when he presented for dehydration and hyponatremia with right upper quadrant pain and nausea and vomiting for 2 weeks.  Noted hyponatremia with sodium of 128, mild AKI with creatinine of 1.34 likely due to dehydration.  Ultrasound unremarkable gallbladder.  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  Patient received fluids and second BMP showed improvement in creatinine from 1.34-1.30.  Patient ate food in the ER without vomiting, felt stable for discharge.  Recommended GI evaluation for persistent nausea, abdominal pain, early satiety.  No previous GI evaluation in our system.  No previous colonoscopy or endoscopy in our system.  Today he states he is doing okay overall. Feels his symptoms are worse. Pain is near constant, generalized, described as dull, crampy. Pain seems to worsen after eating. Also tends to get nauseated after eating, even from first bite. Still with vomiting. Has been about 15 days since he's had good oral intake. He is pushing fluids to prevent dehydration. Has given up sodas which caused severe symptoms. Has chronic GERD symptoms, is on Pepcid (was previously on omeprazole but insurance noted he had to try/fail famotidine first). He did have good GERD control on omeprazole. His current symptoms did start before switch to Pepcid, but the switch hasn't helped. Currently having daily, multiple episodes of reflux/GERD symptoms. He is  concerned about kidnye damage as he only has one kidney. Denies hematochezia, melena, fever, chills. Has had some subjective weight loss with limited intake. Does have dark stools on Pepto Bismol. Denies diarrhea. Denies URI or flu-like symptoms. Denies loss of sense of taste or smell. The patient has received COVID-19 vaccination(s). Denies chest pain, dyspnea, dizziness, lightheadedness, syncope, near syncope. Denies any other upper or lower GI symptoms.  PMH lists CHF, patient states he doesn't have heart failure but he wore a monitor for several days and had "a flutter" of some sort. BNP in 2016 normal. DM with typical CBG 180. Thinks his last a1c was about 8.  Past Medical History:  Diagnosis Date  . Bipolar 1 disorder ([place])   . CHF (congestive heart failure) ([place])   . DDD (degenerative disc disease), cervical   . Depression   . Diabetes mellitus without complication ([place])   . GERD (gastroesophageal reflux disease)   . Hypercholesterolemia   . Hyperlipidemia   . Hypertension   . only has 1 kidney    one kidney  . Parkinson's disease ([place]) 2010    Past Surgical History:  Procedure Laterality Date  . BYPASS AXILLA/BRACHIAL ARTERY Right 1981  . CARPAL TUNNEL RELEASE Right 2010  . NECK SURGERY  2007, 2010, 2013   C3-C7 ant/post fusion, DDD  . TESTICLE REMOVAL Left 1981    Current Outpatient Medications  Medication Sig Dispense Refill  . acarbose (PRECOSE) 50 MG tablet Take 50 mg by mouth in the morning, at noon, and at bedtime.    Marland Kitchen amLODipine (NORVASC) 10 MG tablet Take 10 mg every evening by mouth.  0  . ARIPiprazole (ABILIFY) 30 MG tablet Take 30 mg by mouth daily.    Marland Kitchen atorvastatin (LIPITOR) 80 MG tablet Take 80 mg every morning by mouth.  0  . carbidopa-levodopa (SINEMET IR) 25-100 MG tablet Take 2.5 tablets by mouth in the morning, at noon, and at bedtime. 2 and one-half tablets taken three times daily    . famotidine (PEPCID) 40 MG tablet Take 40 mg by mouth daily.    .  fenofibrate (TRICOR) 145 MG tablet Take 145 mg by mouth every evening.   5  . glimepiride (AMARYL) 1 MG tablet Take 1 mg by mouth every morning.  5  . hydrochlorothiazide (HYDRODIURIL) 25 MG tablet Take 25 mg by mouth daily.    Marland Kitchen levothyroxine (SYNTHROID, LEVOTHROID) 50 MCG tablet Take 50 mcg by mouth every morning.  0  . lisinopril (PRINIVIL,ZESTRIL) 20 MG tablet Take 20 mg by mouth in the morning.   0  . lisinopril (PRINIVIL,ZESTRIL) 5 MG tablet Take 5 mg by mouth in the morning.   0  . metFORMIN (GLUCOPHAGE) 500 MG tablet Take 1,000 mg by mouth 2 (two) times daily with a meal.   0  . omega-3 acid ethyl esters (LOVAZA) 1 G capsule Take 1 capsule by mouth 4 (four) times daily.  4  . ondansetron (ZOFRAN-ODT) 4 MG disintegrating tablet Take 4 mg by mouth 2 (two) times daily as needed.    Marland Kitchen oxyCODONE (OXY IR/ROXICODONE) 5 MG immediate release tablet Take 10 mg by mouth in the morning, at noon, and at bedtime.    Marland Kitchen rOPINIRole (REQUIP) 2 MG tablet Take 2 mg by mouth 3 (three) times daily.  0  . trihexyphenidyl (ARTANE) 2 MG tablet Take 1 mg by mouth 3 (three) times daily.   0  . valACYclovir (VALTREX) 1000 MG tablet Take 1,000 mg by mouth 3 (three) times daily. 7 day course starting on 04/23/2020     No current facility-administered medications for this visit.    Allergies as of 05/09/2020 - Review Complete 05/09/2020  Allergen Reaction Noted  . Depakote [divalproex sodium] Other (See Comments) 04/02/2015  . Sulfa antibiotics Hives 02/09/2015    Family History  Problem Relation Age of Onset  . Hypertension Father     Social History   Socioeconomic History  . Marital status: Married    Spouse name: Not on file  . Number of children: 6  . Years of education: GED  . Highest education level: Not on file  Occupational History    Comment: disabled  Tobacco Use  . Smoking status: Current Every Day Smoker    Packs/day: 0.50    Years: 30.00    Pack years: 15.00    Types: Cigarettes     Last attempt to quit: 08/31/2017    Years since quitting: 2.6  . Smokeless tobacco: Never Used  Vaping Use  . Vaping Use: Never used  Substance and Sexual Activity  . Alcohol use: No  . Drug use: No  . Sexual activity: Not on file  Other Topics Concern  . Not on file  Social History Narrative   Married, lives at home with wife and family   Caffeine use - sodas 5-6 daily   Social Determinants of Health   Financial Resource Strain:   . Difficulty of Paying Living Expenses: Not on file  Food Insecurity:   . Worried About Charity fundraiser in the Last Year: Not on file  . Ran Out of Food in the  Last Year: Not on file  Transportation Needs:   . Lack of Transportation (Medical): Not on file  . Lack of Transportation (Non-Medical): Not on file  Physical Activity:   . Days of Exercise per Week: Not on file  . Minutes of Exercise per Session: Not on file  Stress:   . Feeling of Stress : Not on file  Social Connections:   . Frequency of Communication with Friends and Family: Not on file  . Frequency of Social Gatherings with Friends and Family: Not on file  . Attends Religious Services: Not on file  . Active Member of Clubs or Organizations: Not on file  . Attends Archivist Meetings: Not on file  . Marital Status: Not on file  Intimate Partner Violence:   . Fear of Current or Ex-Partner: Not on file  . Emotionally Abused: Not on file  . Physically Abused: Not on file  . Sexually Abused: Not on file    Subjective: Review of Systems  Constitutional: Negative for chills, fever, malaise/fatigue and weight loss.  HENT: Negative for congestion and sore throat.   Respiratory: Negative for cough and shortness of breath.   Cardiovascular: Negative for chest pain and palpitations.  Gastrointestinal: Positive for abdominal pain, heartburn, nausea and vomiting. Negative for blood in stool, diarrhea and melena.  Musculoskeletal: Negative for joint pain and myalgias.  Skin:  Negative for rash.  Neurological: Negative for dizziness and weakness.  Endo/Heme/Allergies: Does not bruise/bleed easily.  Psychiatric/Behavioral: Negative for depression. The patient is not nervous/anxious.   All other systems reviewed and are negative.      Objective: BP 125/77   Pulse 63   Temp (!) 97.2 F (36.2 C) (Oral)   Ht _0  (1.702 m)   Wt 190 lb 6.4 oz (86.4 kg)   BMI 29.82 kg/m  Physical Exam Vitals and nursing note reviewed.  Constitutional:      General: He is not in acute distress.    Appearance: Normal appearance. He is not ill-appearing, toxic-appearing or diaphoretic.  HENT:     Head: Normocephalic and atraumatic.     Nose: No congestion or rhinorrhea.  Eyes:     General: No scleral icterus. Cardiovascular:     Rate and Rhythm: Normal rate and regular rhythm.     Heart sounds: Normal heart sounds.  Pulmonary:     Effort: Pulmonary effort is normal.     Breath sounds: Normal breath sounds.  Abdominal:     General: Bowel sounds are normal. There is no distension.     Palpations: Abdomen is soft. There is no hepatomegaly, splenomegaly or mass.     Tenderness: There is generalized abdominal tenderness. There is no guarding or rebound.     Hernia: No hernia is present.     Comments: Pain worst in the upper abdomen  Musculoskeletal:     Cervical back: Neck supple.  Skin:    General: Skin is warm and dry.     Coloration: Skin is not jaundiced.     Findings: No bruising or rash.  Neurological:     General: No focal deficit present.     Mental Status: He is alert and oriented to person, place, and time. Mental status is at baseline.  Psychiatric:        Mood and Affect: Mood normal.        Behavior: Behavior normal.        Thought Content: Thought content normal.      Assessment:  Very pleasant 54 year old male who presents on referral from emergency department for significant abdominal pain, nausea/vomiting, also worsening GERD.  Her abdominal  pain is generalized but generally worse in the upper abdomen.  He was recently switched from omeprazole to famotidine.  His symptoms did begin prior to this switch however.  His abdominal pain does worsen with eating which is generally what triggers his nausea and vomiting as well.  His nausea and vomiting is persistent.  He has not had much oral intake in the past 2 weeks.  He only has 1 kidney and is at risk for kidney dysfunction and has been trying to push fluids.  His GERD symptoms have also been flared up quite significantly in the past 2 weeks.  GERD: Stop famotidine and start Protonix 40 mg twice daily.  Worsening GERD, especially if H. pylori or gastritis, esophagitis could result in the symptoms she is experiencing  Abdominal pain with nausea/vomiting: I will check an H. pylori breath test for H. pylori.  Update labs including CBC and CMP to check for kidney function and any sign of infection.  Differentials include esophagitis, gastritis, duodenitis, H. pylori, peptic ulcer disease.  CT and ultrasound unremarkable in the emergency room.  Hopefully Protonix will provide him with some relief.  We will plan for an EGD to further evaluate as well and he would likely benefit from an H. pylori biopsy at that time.  Proceed with EGD with Dr. Abbey Chatters on propofol/MAC in near future: the risks, benefits, and alternatives have been discussed with the patient in detail. The patient states understanding and desires to proceed.  ASA II (DM)  Plan: 1. CBC, CMP, H. pylori breath test 2. Stop famotidine 3. Start Protonix 40 mg twice daily after completing breath test 4. Call if further nausea meds needed 5. Push fluids to prevent dehydration 6. Call for any worsening symptoms especially dizziness, lightheadedness, syncope. 7. EGD with H. pylori biopsy any imminent future 8. Follow-up in 6 months    Thank you for allowing [place] to participate in the care of [place], DNP,  AGNP-C Adult & Gerontological Nurse Practitioner [facility]   05/09/2020 2:16 PM   Disclaimer: This note was dictated with voice recognition software. Similar sounding words can inadvertently be transcribed and may not be corrected upon review.

## 2020-05-10 ENCOUNTER — Other Ambulatory Visit (HOSPITAL_COMMUNITY)
Admission: RE | Admit: 2020-05-10 | Discharge: 2020-05-10 | Disposition: A | Discharge status: Home / Self Care | Payer: Medicare Other | Source: Ambulatory Visit | Attending: Internal Medicine | Admitting: Internal Medicine

## 2020-05-10 DIAGNOSIS — Z20822 Contact with and (suspected) exposure to covid-19: Secondary | ICD-10-CM | POA: Insufficient documentation

## 2020-05-10 DIAGNOSIS — Z01812 Encounter for preprocedural laboratory examination: Secondary | ICD-10-CM | POA: Diagnosis present

## 2020-05-10 LAB — CBC WITH DIFFERENTIAL/PLATELET
Absolute Monocytes: 634 cells/uL (ref 200–950)
Basophils Absolute: 53 cells/uL (ref 0–200)
Basophils Relative: 0.6 %
Eosinophils Absolute: 26 cells/uL (ref 15–500)
Eosinophils Relative: 0.3 %
HCT: 41.9 % (ref 38.5–50.0)
Hemoglobin: 14.7 g/dL (ref 13.2–17.1)
Lymphs Abs: 1857 cells/uL (ref 850–3900)
MCH: 34.1 pg — ABNORMAL HIGH (ref 27.0–33.0)
MCHC: 35.1 g/dL (ref 32.0–36.0)
MCV: 97.2 fL (ref 80.0–100.0)
MPV: 11.7 fL (ref 7.5–12.5)
Monocytes Relative: 7.2 %
Neutro Abs: 6230 cells/uL (ref 1500–7800)
Neutrophils Relative %: 70.8 %
Platelets: 273 10*3/uL (ref 140–400)
RBC: 4.31 10*6/uL (ref 4.20–5.80)
RDW: 13.1 % (ref 11.0–15.0)
Total Lymphocyte: 21.1 %
WBC: 8.8 10*3/uL (ref 3.8–10.8)

## 2020-05-10 LAB — COMPREHENSIVE METABOLIC PANEL
AG Ratio: 1.9 (calc) (ref 1.0–2.5)
ALT: 6 U/L — ABNORMAL LOW (ref 9–46)
AST: 23 U/L (ref 10–35)
Albumin: 4.8 g/dL (ref 3.6–5.1)
Alkaline phosphatase (APISO): 28 U/L — ABNORMAL LOW (ref 35–144)
BUN: 18 mg/dL (ref 7–25)
CO2: 32 mmol/L (ref 20–32)
Calcium: 10.4 mg/dL — ABNORMAL HIGH (ref 8.6–10.3)
Chloride: 92 mmol/L — ABNORMAL LOW (ref 98–110)
Creat: 1.32 mg/dL (ref 0.70–1.33)
Globulin: 2.5 g/dL (calc) (ref 1.9–3.7)
Glucose, Bld: 90 mg/dL (ref 65–139)
Potassium: 4 mmol/L (ref 3.5–5.3)
Sodium: 135 mmol/L (ref 135–146)
Total Bilirubin: 0.6 mg/dL (ref 0.2–1.2)
Total Protein: 7.3 g/dL (ref 6.1–8.1)

## 2020-05-11 LAB — SARS CORONAVIRUS 2 (TAT 6-24 HRS): SARS Coronavirus 2: NEGATIVE

## 2020-05-13 ENCOUNTER — Other Ambulatory Visit: Payer: Self-pay | Admitting: Internal Medicine

## 2020-05-13 ENCOUNTER — Ambulatory Visit (HOSPITAL_COMMUNITY)
Admission: RE | Admit: 2020-05-13 | Discharge: 2020-05-13 | Disposition: A | Discharge status: Home / Self Care | Payer: Medicare Other | Attending: Internal Medicine | Admitting: Internal Medicine

## 2020-05-13 ENCOUNTER — Other Ambulatory Visit: Payer: Self-pay

## 2020-05-13 ENCOUNTER — Encounter (HOSPITAL_COMMUNITY): Payer: Self-pay

## 2020-05-13 ENCOUNTER — Ambulatory Visit (HOSPITAL_COMMUNITY): Payer: Medicare Other | Admitting: Anesthesiology

## 2020-05-13 ENCOUNTER — Encounter (HOSPITAL_COMMUNITY)
Admission: RE | Disposition: A | Discharge status: Home / Self Care | Payer: Self-pay | Source: Home / Self Care | Attending: Internal Medicine

## 2020-05-13 DIAGNOSIS — K319 Disease of stomach and duodenum, unspecified: Secondary | ICD-10-CM | POA: Diagnosis not present

## 2020-05-13 DIAGNOSIS — R1013 Epigastric pain: Secondary | ICD-10-CM | POA: Diagnosis present

## 2020-05-13 DIAGNOSIS — E039 Hypothyroidism, unspecified: Secondary | ICD-10-CM | POA: Diagnosis not present

## 2020-05-13 DIAGNOSIS — E7800 Pure hypercholesterolemia, unspecified: Secondary | ICD-10-CM | POA: Diagnosis not present

## 2020-05-13 DIAGNOSIS — Z8249 Family history of ischemic heart disease and other diseases of the circulatory system: Secondary | ICD-10-CM | POA: Insufficient documentation

## 2020-05-13 DIAGNOSIS — Z888 Allergy status to other drugs, medicaments and biological substances status: Secondary | ICD-10-CM | POA: Insufficient documentation

## 2020-05-13 DIAGNOSIS — Z9079 Acquired absence of other genital organ(s): Secondary | ICD-10-CM | POA: Diagnosis not present

## 2020-05-13 DIAGNOSIS — E119 Type 2 diabetes mellitus without complications: Secondary | ICD-10-CM | POA: Diagnosis not present

## 2020-05-13 DIAGNOSIS — Z882 Allergy status to sulfonamides status: Secondary | ICD-10-CM | POA: Diagnosis not present

## 2020-05-13 DIAGNOSIS — K269 Duodenal ulcer, unspecified as acute or chronic, without hemorrhage or perforation: Secondary | ICD-10-CM | POA: Diagnosis not present

## 2020-05-13 DIAGNOSIS — Z79890 Hormone replacement therapy (postmenopausal): Secondary | ICD-10-CM | POA: Insufficient documentation

## 2020-05-13 DIAGNOSIS — F319 Bipolar disorder, unspecified: Secondary | ICD-10-CM | POA: Insufficient documentation

## 2020-05-13 DIAGNOSIS — F17210 Nicotine dependence, cigarettes, uncomplicated: Secondary | ICD-10-CM | POA: Diagnosis not present

## 2020-05-13 DIAGNOSIS — Z79899 Other long term (current) drug therapy: Secondary | ICD-10-CM | POA: Insufficient documentation

## 2020-05-13 DIAGNOSIS — Z7984 Long term (current) use of oral hypoglycemic drugs: Secondary | ICD-10-CM | POA: Diagnosis not present

## 2020-05-13 DIAGNOSIS — K2100 Gastro-esophageal reflux disease with esophagitis, without bleeding: Secondary | ICD-10-CM

## 2020-05-13 DIAGNOSIS — K2970 Gastritis, unspecified, without bleeding: Secondary | ICD-10-CM

## 2020-05-13 DIAGNOSIS — M1990 Unspecified osteoarthritis, unspecified site: Secondary | ICD-10-CM | POA: Insufficient documentation

## 2020-05-13 DIAGNOSIS — G20 Parkinson's disease: Secondary | ICD-10-CM | POA: Diagnosis not present

## 2020-05-13 DIAGNOSIS — K2980 Duodenitis without bleeding: Secondary | ICD-10-CM | POA: Diagnosis not present

## 2020-05-13 DIAGNOSIS — I10 Essential (primary) hypertension: Secondary | ICD-10-CM | POA: Diagnosis not present

## 2020-05-13 DIAGNOSIS — Q600 Renal agenesis, unilateral: Secondary | ICD-10-CM | POA: Diagnosis not present

## 2020-05-13 DIAGNOSIS — E785 Hyperlipidemia, unspecified: Secondary | ICD-10-CM | POA: Insufficient documentation

## 2020-05-13 HISTORY — PX: BIOPSY: SHX5522

## 2020-05-13 HISTORY — PX: ESOPHAGOGASTRODUODENOSCOPY (EGD) WITH PROPOFOL: SHX5813

## 2020-05-13 LAB — GLUCOSE, CAPILLARY: Glucose-Capillary: 154 mg/dL — ABNORMAL HIGH (ref 70–99)

## 2020-05-13 SURGERY — ESOPHAGOGASTRODUODENOSCOPY (EGD) WITH PROPOFOL
Anesthesia: General

## 2020-05-13 MED ORDER — LIDOCAINE VISCOUS HCL 2 % MT SOLN
15.0000 mL | Freq: Once | OROMUCOSAL | Status: AC
  Administered 2020-05-13: 15 mL via OROMUCOSAL

## 2020-05-13 MED ORDER — CHLORHEXIDINE GLUCONATE CLOTH 2 % EX PADS: 6.0000 | MEDICATED_PAD | Freq: Once | CUTANEOUS | Status: DC

## 2020-05-13 MED ORDER — GLYCOPYRROLATE 0.2 MG/ML IJ SOLN
INTRAMUSCULAR | Status: AC
  Filled 2020-05-13: qty 1

## 2020-05-13 MED ORDER — PROPOFOL 10 MG/ML IV BOLUS
INTRAVENOUS | Status: AC
  Filled 2020-05-13: qty 100

## 2020-05-13 MED ORDER — LIDOCAINE VISCOUS HCL 2 % MT SOLN
OROMUCOSAL | Status: AC
  Filled 2020-05-13: qty 15

## 2020-05-13 MED ORDER — LACTATED RINGERS IV SOLN: INTRAVENOUS | Status: DC | PRN

## 2020-05-13 MED ORDER — SUCRALFATE 1 G PO TABS: 1.0000 g | ORAL_TABLET | Freq: Four times a day (QID) | ORAL | 1 refills | Status: DC

## 2020-05-13 MED ORDER — LACTATED RINGERS IV SOLN: Freq: Once | INTRAVENOUS | Status: AC

## 2020-05-13 MED ORDER — LIDOCAINE HCL (CARDIAC) PF 50 MG/5ML IV SOSY
PREFILLED_SYRINGE | INTRAVENOUS | Status: DC | PRN
  Administered 2020-05-13: 100 mg via INTRAVENOUS

## 2020-05-13 MED ORDER — PROPOFOL 10 MG/ML IV BOLUS
INTRAVENOUS | Status: DC | PRN
  Administered 2020-05-13: 100 mg via INTRAVENOUS
  Administered 2020-05-13 (×2): 50 mg via INTRAVENOUS

## 2020-05-13 MED ORDER — STERILE WATER FOR IRRIGATION IR SOLN
Status: DC | PRN
  Administered 2020-05-13: 1.5 mL

## 2020-05-13 MED ORDER — GLYCOPYRROLATE 0.2 MG/ML IJ SOLN
0.2000 mg | Freq: Once | INTRAMUSCULAR | Status: AC
  Administered 2020-05-13: 0.2 mg via INTRAVENOUS

## 2020-05-13 NOTE — Transfer of Care (Signed)
Immediate Anesthesia Transfer of Care Note  Patient: Jeananne Rama III  Procedure(s) Performed: ESOPHAGOGASTRODUODENOSCOPY (EGD) WITH PROPOFOL (N/A ) BIOPSY  Patient Location: Endoscopy Unit  Anesthesia Type:General  Level of Consciousness: awake, alert , oriented and patient cooperative  Airway & Oxygen Therapy: Patient Spontanous Breathing  Post-op Assessment: Report given to RN, Post -op Vital signs reviewed and stable and Patient moving all extremities  Post vital signs: Reviewed and stable  Last Vitals:  Vitals Value Taken Time  BP 104/84 05/13/20 1137  Temp 37.2 C 05/13/20 1137  Pulse 77 05/13/20 1138  Resp 31 05/13/20 1138  SpO2 97 % 05/13/20 1138  Vitals shown include unvalidated device data.  Last Pain:  Vitals:   05/13/20 1137  TempSrc: Oral  PainSc:          Complications: No complications documented.

## 2020-05-13 NOTE — Anesthesia Preprocedure Evaluation (Addendum)
Anesthesia Evaluation  Patient identified by MRN, date of birth, ID band Patient awake    Reviewed: Allergy & Precautions, NPO status , Patient's Chart, lab work & pertinent test results  History of Anesthesia Complications Negative for: history of anesthetic complications  Airway Mallampati: II  TM Distance: >3 FB Neck ROM: Full    Dental  (+) Upper Dentures, Lower Dentures   Pulmonary neg pulmonary ROS, Current SmokerPatient did not abstain from smoking.,    Pulmonary exam normal breath sounds clear to auscultation       Cardiovascular hypertension, Pt. on medications and Pt. on home beta blockers +CHF  Normal cardiovascular exam Rhythm:Regular Rate:Normal     Neuro/Psych PSYCHIATRIC DISORDERS Depression Bipolar Disorder  Neuromuscular disease (parkinsons disease)    GI/Hepatic GERD  Medicated and Controlled,  Endo/Other  diabetes, Well Controlled, Type 2, Oral Hypoglycemic AgentsHypothyroidism   Renal/GU Renal InsufficiencyRenal disease (born with one kidney)     Musculoskeletal  (+) Arthritis , Osteoarthritis,    Abdominal   Peds  Hematology   Anesthesia Other Findings   Reproductive/Obstetrics                            Anesthesia Physical Anesthesia Plan  ASA: III  Anesthesia Plan: General   Post-op Pain Management:    Induction: Intravenous  PONV Risk Score and Plan: TIVA  Airway Management Planned: Nasal Cannula and Natural Airway  Additional Equipment:   Intra-op Plan:   Post-operative Plan:   Informed Consent: I have reviewed the patients History and Physical, chart, labs and discussed the procedure including the risks, benefits and alternatives for the proposed anesthesia with the patient or authorized representative who has indicated his/her understanding and acceptance.     Dental advisory given  Plan Discussed with: CRNA and Surgeon  Anesthesia Plan  Comments:         Anesthesia Quick Evaluation

## 2020-05-13 NOTE — Discharge Instructions (Signed)
EGD Discharge instructions Please read the instructions outlined below and refer to this sheet in the next few weeks. These discharge instructions provide you with general information on caring for yourself after you leave the hospital. Your doctor may also give you specific instructions. While your treatment has been planned according to the most current medical practices available, unavoidable complications occasionally occur. If you have any problems or questions after discharge, please call your doctor. ACTIVITY  You may resume your regular activity but move at a slower pace for the next 24 hours.   Take frequent rest periods for the next 24 hours.   Walking will help expel (get rid of) the air and reduce the bloated feeling in your abdomen.   No driving for 24 hours (because of the anesthesia (medicine) used during the test).   You may shower.   Do not sign any important legal documents or operate any machinery for 24 hours (because of the anesthesia used during the test).  NUTRITION  Drink plenty of fluids.   You may resume your normal diet.   Begin with a light meal and progress to your normal diet.   Avoid alcoholic beverages for 24 hours or as instructed by your caregiver.  MEDICATIONS  You may resume your normal medications unless your caregiver tells you otherwise.  WHAT YOU CAN EXPECT TODAY  You may experience abdominal discomfort such as a feeling of fullness or "gas" pains.  FOLLOW-UP  Your doctor will discuss the results of your test with you.  SEEK IMMEDIATE MEDICAL ATTENTION IF ANY OF THE FOLLOWING OCCUR:  Excessive nausea (feeling sick to your stomach) and/or vomiting.   Severe abdominal pain and distention (swelling).   Trouble swallowing.   Temperature over 101 F (37.8 C).   Rectal bleeding or vomiting of blood.    Your EGD showed a large amount of inflammation both in your stomach and first part of your small bowel including ulcerations.  I took  biopsies of both and we should have these results back by the end of the week.  I would recommend starting Protonix 40 mg twice daily for at least the next 8 weeks and then we can decrease down to once daily.  Need to avoid all NSAIDs as best as possible including ibuprofen, Aleve, Mobic, Goody powders, etc. follow-up with GI as previously scheduled.  I hope you have a great rest of your week!  Hennie Duos. Marletta Lor, D.O. Gastroenterology and Hepatology [facility]

## 2020-05-13 NOTE — Interval H&P Note (Signed)
History and Physical Interval Note:  05/13/2020 11:19 AM  Steve Nunez  has presented today for surgery, with the diagnosis of abd pain, nausea, vomiting, gerd.  The various methods of treatment have been discussed with the patient and family. After consideration of risks, benefits and other options for treatment, the patient has consented to  Procedure(s) with comments: ESOPHAGOGASTRODUODENOSCOPY (EGD) WITH PROPOFOL (N/A) - 12:30pm as a surgical intervention.  The patient's history has been reviewed, patient examined, no change in status, stable for surgery.  I have reviewed the patient's chart and labs.  Questions were answered to the patient's satisfaction.     [name]

## 2020-05-13 NOTE — Telephone Encounter (Signed)
That's fine, we can cancel it. He should be getting an EGD and we can do a gastric biopsy at that time.

## 2020-05-13 NOTE — Anesthesia Postprocedure Evaluation (Signed)
Anesthesia Post Note  Patient: Jeananne Rama III  Procedure(s) Performed: ESOPHAGOGASTRODUODENOSCOPY (EGD) WITH PROPOFOL (N/A ) BIOPSY  Patient location during evaluation: Endoscopy Anesthesia Type: General Level of consciousness: awake, oriented, awake and alert and patient cooperative Pain management: pain level controlled Vital Signs Assessment: post-procedure vital signs reviewed and stable Respiratory status: spontaneous breathing, respiratory function stable and nonlabored ventilation Cardiovascular status: blood pressure returned to baseline and stable Postop Assessment: no headache and no backache Anesthetic complications: no   No complications documented.   Last Vitals:  Vitals:   05/13/20 1108 05/13/20 1137  BP: 124/84 104/84  Pulse: 85 75  Resp: 11 11  Temp: 37.3 C 37.2 C  SpO2: 99% 97%    Last Pain:  Vitals:   05/13/20 1137  TempSrc: Oral  PainSc:                  Brynda Peon

## 2020-05-13 NOTE — Op Note (Signed)
[facility] Patient Name: Steve Nunez Procedure Date: 05/13/2020 10:38 AM MRN: [id number] Date of Birth: [birth year] Attending MD: Elon Alas. Abbey Chatters DO CSN: [id number] Age: 54 Admit Type: Outpatient Procedure:                Upper GI endoscopy Indications:              Epigastric abdominal pain, Abdominal pain in the                            right upper quadrant, Heartburn Providers:                Elon Alas. Koby Pickup, DO, Otis Peak B. Sharon Seller, RN,                            Caprice Kluver, Aram Candela Referring MD:              Medicines:                See the Anesthesia note for documentation of the                            administered medications Complications:            No immediate complications. Estimated Blood Loss:     Estimated blood loss was minimal. Procedure:                Pre-Anesthesia Assessment:                           - The anesthesia plan was to use monitored                            anesthesia care (MAC).                           After obtaining informed consent, the endoscope was                            passed under direct vision. Throughout the                            procedure, the patient's blood pressure, pulse, and                            oxygen saturations were monitored continuously. The                            [id number]) was introduced through the mouth,                            and advanced to the second part of duodenum. The                            upper GI endoscopy was accomplished without                            difficulty. The  patient tolerated the procedure                            well. Scope In: 11:30:18 AM Scope Out: 11:34:02 AM Total Procedure Duration: 0 hours 3 minutes 44 seconds  Findings:      LA Grade A (one or more mucosal breaks less than 5 mm, not extending       between tops of 2 mucosal folds) esophagitis with no bleeding was found       at the gastroesophageal junction.      Localized  moderate inflammation characterized by erosions and erythema       was found in the gastric antrum. Biopsies were taken with a cold forceps       for Helicobacter pylori testing.      Localized moderate inflammation characterized by erosions, erythema and       shallow ulcerations was found in the duodenal bulb and in the first       portion of the duodenum. Biopsies were taken with a cold forceps for       histology. Impression:               - LA Grade A reflux esophagitis with no bleeding.                           - Gastritis. Biopsied.                           - Duodenitis. Biopsied. Moderate Sedation:      Per Anesthesia Care Recommendation:           - Patient has a contact number available for                            emergencies. The signs and symptoms of potential                            delayed complications were discussed with the                            patient. Return to normal activities tomorrow.                            Written discharge instructions were provided to the                            patient.                           - Resume previous diet.                           - Continue present medications.                           - Await pathology results.                           - Use Protonix (pantoprazole) 40 mg PO BID for 8  weeks then back down to daily if tolerated.                           - Return to GI clinic as previously scheduled. Procedure Code(s):        --- Professional ---                           [id number], Esophagogastroduodenoscopy, flexible,                            transoral; with biopsy, single or multiple Diagnosis Code(s):        --- Professional ---                           K21.00, Gastro-esophageal reflux disease with                            esophagitis, without bleeding                           K29.70, Gastritis, unspecified, without bleeding                           K29.80, Duodenitis  without bleeding                           R10.13, Epigastric pain                           R10.11, Right upper quadrant pain                           R12, Heartburn CPT copyright 2019 [employer]. All rights reserved. The codes documented in this report are preliminary and upon coder review may  be revised to meet current compliance requirements. Elon Alas. Abbey Chatters, Pope Abbey Chatters, DO 05/13/2020 11:38:30 AM This report has been signed electronically. Number of Addenda: 0

## 2020-05-14 ENCOUNTER — Other Ambulatory Visit: Payer: Self-pay

## 2020-05-14 LAB — SURGICAL PATHOLOGY

## 2020-05-14 NOTE — Telephone Encounter (Signed)
Spoke with pts spouse. She will let pt know that he doesn't nee to complete the breath test.

## 2020-05-15 ENCOUNTER — Encounter (HOSPITAL_COMMUNITY): Payer: Self-pay | Admitting: Internal Medicine

## 2020-06-03 ENCOUNTER — Inpatient Hospital Stay (HOSPITAL_COMMUNITY)
Admission: EM | Admit: 2020-06-03 | Discharge: 2020-06-06 | DRG: 872 | Disposition: A | Discharge status: Home / Self Care | Payer: Medicare Other | Attending: Family Medicine | Admitting: Family Medicine

## 2020-06-03 ENCOUNTER — Emergency Department (HOSPITAL_COMMUNITY): Payer: Medicare Other

## 2020-06-03 ENCOUNTER — Other Ambulatory Visit: Payer: Self-pay

## 2020-06-03 ENCOUNTER — Encounter (HOSPITAL_COMMUNITY): Payer: Self-pay | Admitting: Emergency Medicine

## 2020-06-03 DIAGNOSIS — K219 Gastro-esophageal reflux disease without esophagitis: Secondary | ICD-10-CM | POA: Diagnosis present

## 2020-06-03 DIAGNOSIS — L0231 Cutaneous abscess of buttock: Secondary | ICD-10-CM | POA: Diagnosis present

## 2020-06-03 DIAGNOSIS — Z8249 Family history of ischemic heart disease and other diseases of the circulatory system: Secondary | ICD-10-CM

## 2020-06-03 DIAGNOSIS — N179 Acute kidney failure, unspecified: Secondary | ICD-10-CM | POA: Diagnosis present

## 2020-06-03 DIAGNOSIS — N492 Inflammatory disorders of scrotum: Secondary | ICD-10-CM | POA: Diagnosis present

## 2020-06-03 DIAGNOSIS — E785 Hyperlipidemia, unspecified: Secondary | ICD-10-CM | POA: Diagnosis present

## 2020-06-03 DIAGNOSIS — Z79890 Hormone replacement therapy (postmenopausal): Secondary | ICD-10-CM

## 2020-06-03 DIAGNOSIS — Z981 Arthrodesis status: Secondary | ICD-10-CM

## 2020-06-03 DIAGNOSIS — Z20822 Contact with and (suspected) exposure to covid-19: Secondary | ICD-10-CM | POA: Diagnosis present

## 2020-06-03 DIAGNOSIS — E876 Hypokalemia: Secondary | ICD-10-CM | POA: Diagnosis present

## 2020-06-03 DIAGNOSIS — K611 Rectal abscess: Secondary | ICD-10-CM | POA: Diagnosis not present

## 2020-06-03 DIAGNOSIS — E119 Type 2 diabetes mellitus without complications: Secondary | ICD-10-CM | POA: Diagnosis present

## 2020-06-03 DIAGNOSIS — Z7984 Long term (current) use of oral hypoglycemic drugs: Secondary | ICD-10-CM

## 2020-06-03 DIAGNOSIS — Z23 Encounter for immunization: Secondary | ICD-10-CM

## 2020-06-03 DIAGNOSIS — Z79899 Other long term (current) drug therapy: Secondary | ICD-10-CM

## 2020-06-03 DIAGNOSIS — I10 Essential (primary) hypertension: Secondary | ICD-10-CM | POA: Diagnosis present

## 2020-06-03 DIAGNOSIS — Q600 Renal agenesis, unilateral: Secondary | ICD-10-CM

## 2020-06-03 DIAGNOSIS — Z888 Allergy status to other drugs, medicaments and biological substances status: Secondary | ICD-10-CM

## 2020-06-03 DIAGNOSIS — Z9079 Acquired absence of other genital organ(s): Secondary | ICD-10-CM

## 2020-06-03 DIAGNOSIS — A419 Sepsis, unspecified organism: Secondary | ICD-10-CM | POA: Diagnosis not present

## 2020-06-03 DIAGNOSIS — F17210 Nicotine dependence, cigarettes, uncomplicated: Secondary | ICD-10-CM | POA: Diagnosis present

## 2020-06-03 DIAGNOSIS — Z882 Allergy status to sulfonamides status: Secondary | ICD-10-CM

## 2020-06-03 DIAGNOSIS — G20 Parkinson's disease: Secondary | ICD-10-CM | POA: Diagnosis present

## 2020-06-03 DIAGNOSIS — F319 Bipolar disorder, unspecified: Secondary | ICD-10-CM | POA: Diagnosis present

## 2020-06-03 LAB — CBC WITH DIFFERENTIAL/PLATELET
Abs Immature Granulocytes: 0.08 10*3/uL — ABNORMAL HIGH (ref 0.00–0.07)
Basophils Absolute: 0 10*3/uL (ref 0.0–0.1)
Basophils Relative: 0 %
Eosinophils Absolute: 0 10*3/uL (ref 0.0–0.5)
Eosinophils Relative: 0 %
HCT: 34.6 % — ABNORMAL LOW (ref 39.0–52.0)
Hemoglobin: 12 g/dL — ABNORMAL LOW (ref 13.0–17.0)
Immature Granulocytes: 1 %
Lymphocytes Relative: 6 %
Lymphs Abs: 0.7 10*3/uL (ref 0.7–4.0)
MCH: 34.3 pg — ABNORMAL HIGH (ref 26.0–34.0)
MCHC: 34.7 g/dL (ref 30.0–36.0)
MCV: 98.9 fL (ref 80.0–100.0)
Monocytes Absolute: 1 10*3/uL (ref 0.1–1.0)
Monocytes Relative: 8 %
Neutro Abs: 10 10*3/uL — ABNORMAL HIGH (ref 1.7–7.7)
Neutrophils Relative %: 85 %
Platelets: 191 10*3/uL (ref 150–400)
RBC: 3.5 MIL/uL — ABNORMAL LOW (ref 4.22–5.81)
RDW: 13.2 % (ref 11.5–15.5)
WBC: 11.8 10*3/uL — ABNORMAL HIGH (ref 4.0–10.5)
nRBC: 0 % (ref 0.0–0.2)

## 2020-06-03 LAB — COMPREHENSIVE METABOLIC PANEL
ALT: 13 U/L (ref 0–44)
AST: 27 U/L (ref 15–41)
Albumin: 3.9 g/dL (ref 3.5–5.0)
Alkaline Phosphatase: 30 U/L — ABNORMAL LOW (ref 38–126)
Anion gap: 12 (ref 5–15)
BUN: 36 mg/dL — ABNORMAL HIGH (ref 6–20)
CO2: 26 mmol/L (ref 22–32)
Calcium: 8.8 mg/dL — ABNORMAL LOW (ref 8.9–10.3)
Chloride: 92 mmol/L — ABNORMAL LOW (ref 98–111)
Creatinine, Ser: 1.8 mg/dL — ABNORMAL HIGH (ref 0.61–1.24)
GFR calc Af Amer: 49 mL/min — ABNORMAL LOW (ref >60)
GFR calc non Af Amer: 42 mL/min — ABNORMAL LOW (ref >60)
Glucose, Bld: 129 mg/dL — ABNORMAL HIGH (ref 70–99)
Potassium: 3.3 mmol/L — ABNORMAL LOW (ref 3.5–5.1)
Sodium: 130 mmol/L — ABNORMAL LOW (ref 135–145)
Total Bilirubin: 1.5 mg/dL — ABNORMAL HIGH (ref 0.3–1.2)
Total Protein: 7.3 g/dL (ref 6.5–8.1)

## 2020-06-03 LAB — LACTIC ACID, PLASMA: Lactic Acid, Venous: 0.9 mmol/L (ref 0.5–1.9)

## 2020-06-03 MED ORDER — METRONIDAZOLE IN NACL 5-0.79 MG/ML-% IV SOLN
500.0000 mg | Freq: Once | INTRAVENOUS | Status: AC
  Administered 2020-06-03: 500 mg via INTRAVENOUS
  Filled 2020-06-03: qty 100

## 2020-06-03 MED ORDER — SODIUM CHLORIDE 0.9 % IV SOLN
1.0000 g | Freq: Once | INTRAVENOUS | Status: AC
  Administered 2020-06-03: 1 g via INTRAVENOUS
  Filled 2020-06-03: qty 10

## 2020-06-03 MED ORDER — LIDOCAINE-EPINEPHRINE (PF) 2 %-1:200000 IJ SOLN
20.0000 mL | Freq: Once | INTRAMUSCULAR | Status: AC
  Administered 2020-06-03: 20 mL
  Filled 2020-06-03: qty 20

## 2020-06-03 MED ORDER — SODIUM CHLORIDE 0.9 % IV BOLUS
1000.0000 mL | Freq: Once | INTRAVENOUS | Status: AC
  Administered 2020-06-03: 1000 mL via INTRAVENOUS

## 2020-06-03 MED ORDER — HYDROMORPHONE HCL 1 MG/ML IJ SOLN
1.0000 mg | Freq: Once | INTRAMUSCULAR | Status: AC
  Administered 2020-06-03: 1 mg via INTRAVENOUS
  Filled 2020-06-03: qty 1

## 2020-06-03 NOTE — ED Triage Notes (Signed)
Pt c/o of perineum abscess x5 days. Pt sent from urgent care. Pt is febrile .

## 2020-06-03 NOTE — ED Notes (Signed)
This RN spoke with pt's wife who is upset that pt has not been seen by a doctor yet because he was sent over from Urgent Care; I informed her that the pt did not meet sepsis protocol at this time but he would have lab work performed while waiting

## 2020-06-03 NOTE — ED Provider Notes (Signed)
[facility]   CSN: [id number] Arrival date & time: 06/03/20  1546     History Chief Complaint  Patient presents with  . Abscess    Steve Nunez is a 54 y.o. male.  He is a diabetic history of perirectal abscess.  He is complaining of 4 days of perirectal and perineal pain that is gradually going towards the scrotum.  He went to urgent care.  He has a low-grade temperature.  He said they have given him antibiotics before and they have trainees.  Able to move his bowels.  No nausea or vomiting.  The history is provided by the patient.  Abscess Location:  Pelvis Pelvic abscess location:  Perianal and perineum Abscess quality: fluctuance, induration, painful and redness   Red streaking: no   Progression:  Worsening Pain details:    Quality:  Throbbing   Severity:  Moderate   Timing:  Constant   Progression:  Worsening Chronicity:  Recurrent Context: diabetes   Relieved by:  Nothing Worsened by:  Nothing Ineffective treatments:  None tried Associated symptoms: fever   Associated symptoms: no nausea and no vomiting   Risk factors: prior abscess        Past Medical History:  Diagnosis Date  . Bipolar 1 disorder (HCC)   . CHF (congestive heart failure) (HCC)   . DDD (degenerative disc disease), cervical   . Depression   . Diabetes mellitus without complication (HCC)   . GERD (gastroesophageal reflux disease)   . Hypercholesterolemia   . Hyperlipidemia   . Hypertension   . only has 1 kidney    one kidney  . Parkinson's disease [facility]) 2010    Patient Active Problem List   Diagnosis Date Noted  . Abdominal pain 05/09/2020  . Nausea with vomiting 05/09/2020  . GERD (gastroesophageal reflux disease) 05/09/2020  . Parkinsonism (HCC) 04/02/2015  . Chest pain 02/11/2015  . Edema leg 02/11/2015    Past Surgical History:  Procedure Laterality Date  . BIOPSY  05/13/2020   Procedure: BIOPSY;  Surgeon: [name], DO;   Location: [facility];  Service: Endoscopy;;  . BYPASS AXILLA/BRACHIAL ARTERY Right 1981  . CARPAL TUNNEL RELEASE Right 2010  . ESOPHAGOGASTRODUODENOSCOPY (EGD) WITH PROPOFOL N/A 05/13/2020   Procedure: ESOPHAGOGASTRODUODENOSCOPY (EGD) WITH PROPOFOL;  Surgeon: [name], DO;  Location: [facility] SU[facility];  Service: Endoscopy;  Laterality: N/A;  12:30pm  . NECK SURGERY  2007, 2010, 2013   C3-C7 ant/post fusion, DDD  . TESTICLE REMOVAL Left 1981       Family History  Problem Relation Age of Onset  . Hypertension Father     Social History   Tobacco Use  . Smoking status: Current Every Day Smoker    Packs/day: 0.50    Years: 30.00    Pack years: 15.00    Types: Cigarettes    Last attempt to quit: 08/31/2017    Years since quitting: 2.7  . Smokeless tobacco: Never Used  Vaping Use  . Vaping Use: Never used  Substance Use Topics  . Alcohol use: No  . Drug use: No    Home Medications Prior to Admission medications   Medication Sig Start Date End Date Taking? Authorizing Provider  acarbose (PRECOSE) 50 MG tablet Take 50 mg by mouth in the morning, at noon, and at bedtime. 10/10/19   [provider]  amLODipine (NORVASC) 10 MG tablet Take 10 mg every evening by mouth.  05/04/17   [provider]  ARIPiprazole (ABILIFY) 15 MG tablet Take 15 mg by mouth 2 (two) times daily.  06/13/19   [provider]  atorvastatin (LIPITOR) 80 MG tablet Take 80 mg by mouth daily.  06/03/17   [provider]  carbidopa-levodopa (SINEMET IR) 25-100 MG tablet Take 2.5 tablets by mouth in the morning, at noon, and at bedtime.  10/10/19   [provider]  famotidine (PEPCID) 40 MG tablet Take 40 mg by mouth daily. 04/17/20   [provider]  fenofibrate (TRICOR) 145 MG tablet Take 145 mg by mouth every evening.  01/14/15   [provider]  glimepiride (AMARYL) 1 MG tablet Take 1 mg by mouth daily.  01/14/15   [provider]    hydrochlorothiazide (HYDRODIURIL) 25 MG tablet Take 25 mg by mouth daily. 10/18/19   [provider]  levothyroxine (SYNTHROID, LEVOTHROID) 50 MCG tablet Take 50 mcg by mouth daily before breakfast.  11/29/14   [provider]  lisinopril (PRINIVIL,ZESTRIL) 20 MG tablet Take 20 mg by mouth in the morning. Take with 5 mg for a total of 25 mg daily 11/29/14   [provider]  lisinopril (PRINIVIL,ZESTRIL) 5 MG tablet Take 5 mg by mouth in the morning. Take with 20 mg for a total of 25 mg 01/04/18   [provider]  metFORMIN (GLUCOPHAGE) 500 MG tablet Take 1,000 mg by mouth 2 (two) times daily with a meal.  07/06/17   [provider]  nystatin ointment (MYCOSTATIN) Apply 1 application topically daily as needed for mouth pain. 04/24/20   [provider]  omega-3 acid ethyl esters (LOVAZA) 1 G capsule Take 1 capsule by mouth 4 (four) times daily. 02/01/15   [provider]  ondansetron (ZOFRAN-ODT) 4 MG disintegrating tablet Take 4 mg by mouth 2 (two) times daily as needed for nausea or vomiting.  04/23/20   [provider]  Oxycodone HCl 10 MG TABS Take 10 mg by mouth 3 (three) times daily as needed for moderate pain or severe pain.  04/23/20   [provider]  pantoprazole (PROTONIX) 40 MG tablet Take 1 tablet (40 mg total) by mouth 2 (two) times daily before a meal. 05/09/20   Anice PaganiniGill, Eric A, NP  rOPINIRole (REQUIP) 2 MG tablet Take 2 mg by mouth 3 (three) times daily. 11/29/14   [provider]  sucralfate (CARAFATE) 1 g tablet Take 1 tablet (1 g total) by mouth 4 (four) times daily. 05/13/20 05/13/21  Lanelle Balarver, Charles K, DO  trihexyphenidyl (ARTANE) 2 MG tablet Take 1 mg by mouth 3 (three) times daily.  11/29/14   [provider]    Allergies    Depakote [divalproex sodium] and Sulfa antibiotics  Review of Systems   Review of Systems  Constitutional: Positive for fever.  HENT: Negative for sore throat.   Eyes:  Negative for visual disturbance.  Respiratory: Negative for shortness of breath.   Cardiovascular: Negative for chest pain.  Gastrointestinal: Negative for abdominal pain, nausea and vomiting.  Genitourinary: Positive for scrotal swelling. Negative for dysuria.  Musculoskeletal: Negative for neck pain.  Skin: Negative for rash.  Neurological: Negative for syncope.    Physical Exam Updated Vital Signs BP 133/74 (BP Location: Right Arm)   Pulse 96   Temp (!) 100.8 F (38.2 C) (Oral)   Resp 17   Ht 5\' 7"  (1.702 m)   Wt 83.9 kg   SpO2 100%   BMI 28.98 kg/m   Physical Exam Vitals  and nursing note reviewed.  Constitutional:      Appearance: He is well-developed.  HENT:     Head: Normocephalic and atraumatic.  Eyes:     Conjunctiva/sclera: Conjunctivae normal.  Cardiovascular:     Rate and Rhythm: Normal rate and regular rhythm.     Heart sounds: No murmur heard.   Pulmonary:     Effort: Pulmonary effort is normal. No respiratory distress.     Breath sounds: Normal breath sounds.  Abdominal:     Palpations: Abdomen is soft.     Tenderness: There is no abdominal tenderness.  Genitourinary:   Musculoskeletal:        General: Normal range of motion.     Cervical back: Neck supple.  Skin:    General: Skin is warm and dry.  Neurological:     General: No focal deficit present.     Mental Status: He is alert.     Sensory: No sensory deficit.     Motor: No weakness.     Gait: Gait normal.     ED Results / Procedures / Treatments   Labs (all labs ordered are listed, but only abnormal results are displayed) Labs Reviewed  COMPREHENSIVE METABOLIC PANEL - Abnormal; Notable for the following components:      Result Value   Sodium 130 (*)    Potassium 3.3 (*)    Chloride 92 (*)    Glucose, Bld 129 (*)    BUN 36 (*)    Creatinine, Ser 1.80 (*)    Calcium 8.8 (*)    Alkaline Phosphatase 30 (*)    Total Bilirubin 1.5 (*)    GFR calc non Af Amer 42 (*)    GFR calc Af  Amer 49 (*)    All other components within normal limits  CBC WITH DIFFERENTIAL/PLATELET - Abnormal; Notable for the following components:   WBC 11.8 (*)    RBC 3.50 (*)    Hemoglobin 12.0 (*)    HCT 34.6 (*)    MCH 34.3 (*)    Neutro Abs 10.0 (*)    Abs Immature Granulocytes 0.08 (*)    All other components within normal limits  COMPREHENSIVE METABOLIC PANEL - Abnormal; Notable for the following components:   Sodium 132 (*)    Potassium 3.2 (*)    Glucose, Bld 107 (*)    BUN 32 (*)    Creatinine, Ser 1.50 (*)    Calcium 7.8 (*)    Total Protein 5.9 (*)    Albumin 3.1 (*)    Alkaline Phosphatase 24 (*)    GFR calc non Af Amer 52 (*)    All other components within normal limits  MAGNESIUM - Abnormal; Notable for the following components:   Magnesium 0.9 (*)    All other components within normal limits  CBC WITH DIFFERENTIAL/PLATELET - Abnormal; Notable for the following components:   RBC 2.93 (*)    Hemoglobin 10.3 (*)    HCT 28.9 (*)    MCH 35.2 (*)    All other components within normal limits  GLUCOSE, CAPILLARY - Abnormal; Notable for the following components:   Glucose-Capillary 142 (*)    All other components within normal limits  RESPIRATORY PANEL BY RT PCR (FLU A&B, COVID)  CULTURE, BLOOD (ROUTINE X 2)  CULTURE, BLOOD (ROUTINE X 2)  LACTIC ACID, PLASMA  URINALYSIS, ROUTINE W REFLEX MICROSCOPIC  HIV ANTIBODY (ROUTINE TESTING W REFLEX)    EKG None  Radiology CT PELVIS WO CONTRAST  Result Date: 06/04/2020 CLINICAL DATA:  Abscess x5 days. EXAM: CT PELVIS WITHOUT CONTRAST TECHNIQUE: Multidetector CT imaging of the pelvis was performed following the standard protocol without intravenous contrast. COMPARISON:  October 31, 2019.  04/29/2020 FINDINGS: Urinary Tract:  The urinary bladder is distended. Bowel:  Unremarkable visualized pelvic bowel loops. Vascular/Lymphatic: There are atherosclerotic changes of the abdominal aorta. There are enlarged pelvic and inguinal lymph  nodes, likely reactive. Reproductive:  No mass or other significant abnormality Other: There are inflammatory changes in the patient's left perineum. There appears to be packing material within the left gluteal cleft. There is no definite well-formed drainable fluid collection. There is scrotal wall thickening, left worse than right. Musculoskeletal: No suspicious bone lesions identified. IMPRESSION: 1. Inflammatory changes within the left perineum likely representing a soft tissue infection. There is hyperdense material within the left gluteal cleft favored to represent packing material, perhaps from a prior incision and drainage. Correlation with the patient's history is recommended. At this time, there is no well-formed drainable fluid collection. 2. Scrotal wall thickening, likely reactive. However, cellulitis can have a similar appearance and should be correlated with physical exam. 3. Enlarged inguinal and pelvic lymph nodes, likely reactive. 4.  Aortic Atherosclerosis (ICD10-I70.0). Electronically Signed   By: Katherine Mantle M.D.   On: 06/04/2020 00:11   DG Chest Portable 1 View  Result Date: 06/03/2020 CLINICAL DATA:  Initial evaluation for possible sepsis, fever. History of CHF. EXAM: PORTABLE CHEST 1 VIEW COMPARISON:  Prior radiograph from 02/09/2015. FINDINGS: Patient is rotated to the right. Cardiac and mediastinal silhouettes within normal limits. Lungs normally inflated. Minimal streaky left basilar subsegmental atelectasis. No focal infiltrates. No edema or effusion. No pneumothorax. No acute osseous findings. Postsurgical changes partially visualize within the cervical spine. IMPRESSION: Minimal streaky left basilar subsegmental atelectasis. No other active cardiopulmonary disease identified. Electronically Signed   By: Rise Mu M.D.   On: 06/03/2020 20:33    Procedures .Marland KitchenIncision and Drainage  Date/Time: 06/03/2020 11:29 PM Performed by: Terrilee Files, MD Authorized  by: Terrilee Files, MD   Consent:    Consent obtained:  Verbal   Consent given by:  Patient   Risks discussed:  Bleeding, incomplete drainage, pain and infection   Alternatives discussed:  No treatment, delayed treatment and referral Location:    Type:  Abscess   Size:  5   Location:  Anogenital   Anogenital location:  Perineum Pre-procedure details:    Skin preparation:  Betadine Anesthesia (see MAR for exact dosages):    Anesthesia method:  Local infiltration   Local anesthetic:  Lidocaine 2% WITH epi Procedure type:    Complexity:  Complex Procedure details:    Incision types:  Single straight   Scalpel blade:  15   Wound management:  Probed and deloculated   Drainage:  Purulent   Drainage amount:  Copious   Packing materials:  1/2 in iodoform gauze Post-procedure details:    Patient tolerance of procedure:  Tolerated well, no immediate complications   (including critical care time)  Medications Ordered in ED Medications  sodium chloride 0.9 % bolus 1,000 mL (has no administration in time range)  HYDROmorphone (DILAUDID) injection 1 mg (has no administration in time range)  cefTRIAXone (ROCEPHIN) 1 g in sodium chloride 0.9 % 100 mL IVPB (has no administration in time range)  metroNIDAZOLE (FLAGYL) IVPB 500 mg (has no administration in time range)    ED Course  I have reviewed the triage vital signs and the  nursing notes.  Pertinent labs & imaging results that were available during my care of the patient were reviewed by me and considered in my medical decision making (see chart for details).    MDM Rules/Calculators/A&P                         This patient complains of recurrent abscess; this involves an extensive number of treatment Options and is a complaint that carries with it a high risk of complications and Morbidity. The differential includes scrotal abscess, perineal abscess, perirectal abscess, ischiorectal abscess, cellulitis  I ordered, reviewed and  interpreted labs, which included CBC with normal white count, hemoglobin lower than baseline, chemistries with elevated creatinine low calcium, Lactate normal, I ordered medication IV fluids and pain medicine, IV antibiotics I ordered imaging studies which included chest x-ray and CT abdomen and pelvis and I independently    visualized and interpreted imaging which showed inflammatory changes left perineal and scrotal area Additional history obtained from patient's wife Previous records obtained and reviewed in epic, can see at least 2 visits where he had local I&D  After the interventions stated above, I reevaluated the patient and found patient still to be very uncomfortable despite pain medication and drainage.  He is pending CAT scan abdomen and pelvis.  Signed out to oncoming provider Dr. Wilkie Aye to reassess patient after CT to make sure there is not a another abscess that I did not drain.  Possibly will need admission for continued IV antibiotics and hydration.   Final Clinical Impression(s) / ED Diagnoses Final diagnoses:  Perirectal abscess  Sepsis with acute renal failure without septic shock, due to unspecified organism, unspecified acute renal failure type [facility])    Rx / DC Orders ED Discharge Orders    None       Terrilee Files, MD 06/04/20 [phone]

## 2020-06-04 DIAGNOSIS — Z981 Arthrodesis status: Secondary | ICD-10-CM | POA: Diagnosis not present

## 2020-06-04 DIAGNOSIS — A419 Sepsis, unspecified organism: Secondary | ICD-10-CM | POA: Diagnosis present

## 2020-06-04 DIAGNOSIS — Z8249 Family history of ischemic heart disease and other diseases of the circulatory system: Secondary | ICD-10-CM | POA: Diagnosis not present

## 2020-06-04 DIAGNOSIS — G20 Parkinson's disease: Secondary | ICD-10-CM | POA: Diagnosis present

## 2020-06-04 DIAGNOSIS — I10 Essential (primary) hypertension: Secondary | ICD-10-CM | POA: Diagnosis present

## 2020-06-04 DIAGNOSIS — Z79899 Other long term (current) drug therapy: Secondary | ICD-10-CM | POA: Diagnosis not present

## 2020-06-04 DIAGNOSIS — Z7984 Long term (current) use of oral hypoglycemic drugs: Secondary | ICD-10-CM | POA: Diagnosis not present

## 2020-06-04 DIAGNOSIS — N179 Acute kidney failure, unspecified: Secondary | ICD-10-CM

## 2020-06-04 DIAGNOSIS — K611 Rectal abscess: Secondary | ICD-10-CM | POA: Diagnosis present

## 2020-06-04 DIAGNOSIS — F319 Bipolar disorder, unspecified: Secondary | ICD-10-CM | POA: Diagnosis present

## 2020-06-04 DIAGNOSIS — Z9079 Acquired absence of other genital organ(s): Secondary | ICD-10-CM | POA: Diagnosis not present

## 2020-06-04 DIAGNOSIS — F17210 Nicotine dependence, cigarettes, uncomplicated: Secondary | ICD-10-CM | POA: Diagnosis present

## 2020-06-04 DIAGNOSIS — K219 Gastro-esophageal reflux disease without esophagitis: Secondary | ICD-10-CM | POA: Diagnosis present

## 2020-06-04 DIAGNOSIS — E785 Hyperlipidemia, unspecified: Secondary | ICD-10-CM | POA: Diagnosis present

## 2020-06-04 DIAGNOSIS — L0231 Cutaneous abscess of buttock: Secondary | ICD-10-CM | POA: Diagnosis present

## 2020-06-04 DIAGNOSIS — Z888 Allergy status to other drugs, medicaments and biological substances status: Secondary | ICD-10-CM | POA: Diagnosis not present

## 2020-06-04 DIAGNOSIS — R6520 Severe sepsis without septic shock: Secondary | ICD-10-CM

## 2020-06-04 DIAGNOSIS — Z882 Allergy status to sulfonamides status: Secondary | ICD-10-CM | POA: Diagnosis not present

## 2020-06-04 DIAGNOSIS — E119 Type 2 diabetes mellitus without complications: Secondary | ICD-10-CM | POA: Diagnosis present

## 2020-06-04 DIAGNOSIS — Q600 Renal agenesis, unilateral: Secondary | ICD-10-CM | POA: Diagnosis not present

## 2020-06-04 DIAGNOSIS — Z79890 Hormone replacement therapy (postmenopausal): Secondary | ICD-10-CM | POA: Diagnosis not present

## 2020-06-04 DIAGNOSIS — N492 Inflammatory disorders of scrotum: Secondary | ICD-10-CM | POA: Diagnosis present

## 2020-06-04 DIAGNOSIS — Z20822 Contact with and (suspected) exposure to covid-19: Secondary | ICD-10-CM | POA: Diagnosis present

## 2020-06-04 DIAGNOSIS — Z23 Encounter for immunization: Secondary | ICD-10-CM | POA: Diagnosis present

## 2020-06-04 DIAGNOSIS — E876 Hypokalemia: Secondary | ICD-10-CM | POA: Diagnosis present

## 2020-06-04 LAB — CBC WITH DIFFERENTIAL/PLATELET
Abs Immature Granulocytes: 0.04 10*3/uL (ref 0.00–0.07)
Basophils Absolute: 0 10*3/uL (ref 0.0–0.1)
Basophils Relative: 0 %
Eosinophils Absolute: 0 10*3/uL (ref 0.0–0.5)
Eosinophils Relative: 0 %
HCT: 28.9 % — ABNORMAL LOW (ref 39.0–52.0)
Hemoglobin: 10.3 g/dL — ABNORMAL LOW (ref 13.0–17.0)
Immature Granulocytes: 1 %
Lymphocytes Relative: 11 %
Lymphs Abs: 1 10*3/uL (ref 0.7–4.0)
MCH: 35.2 pg — ABNORMAL HIGH (ref 26.0–34.0)
MCHC: 35.6 g/dL (ref 30.0–36.0)
MCV: 98.6 fL (ref 80.0–100.0)
Monocytes Absolute: 0.7 10*3/uL (ref 0.1–1.0)
Monocytes Relative: 7 %
Neutro Abs: 7.1 10*3/uL (ref 1.7–7.7)
Neutrophils Relative %: 81 %
Platelets: 159 10*3/uL (ref 150–400)
RBC: 2.93 MIL/uL — ABNORMAL LOW (ref 4.22–5.81)
RDW: 13.2 % (ref 11.5–15.5)
WBC: 8.8 10*3/uL (ref 4.0–10.5)
nRBC: 0 % (ref 0.0–0.2)

## 2020-06-04 LAB — URINALYSIS, ROUTINE W REFLEX MICROSCOPIC
Bilirubin Urine: NEGATIVE
Glucose, UA: NEGATIVE mg/dL
Hgb urine dipstick: NEGATIVE
Ketones, ur: NEGATIVE mg/dL
Leukocytes,Ua: NEGATIVE
Nitrite: NEGATIVE
Protein, ur: NEGATIVE mg/dL
Specific Gravity, Urine: 1.009 (ref 1.005–1.030)
pH: 5 (ref 5.0–8.0)

## 2020-06-04 LAB — COMPREHENSIVE METABOLIC PANEL
ALT: 13 U/L (ref 0–44)
AST: 21 U/L (ref 15–41)
Albumin: 3.1 g/dL — ABNORMAL LOW (ref 3.5–5.0)
Alkaline Phosphatase: 24 U/L — ABNORMAL LOW (ref 38–126)
Anion gap: 9 (ref 5–15)
BUN: 32 mg/dL — ABNORMAL HIGH (ref 6–20)
CO2: 25 mmol/L (ref 22–32)
Calcium: 7.8 mg/dL — ABNORMAL LOW (ref 8.9–10.3)
Chloride: 98 mmol/L (ref 98–111)
Creatinine, Ser: 1.5 mg/dL — ABNORMAL HIGH (ref 0.61–1.24)
GFR calc Af Amer: >60 mL/min (ref >60)
GFR calc non Af Amer: 52 mL/min — ABNORMAL LOW (ref >60)
Glucose, Bld: 107 mg/dL — ABNORMAL HIGH (ref 70–99)
Potassium: 3.2 mmol/L — ABNORMAL LOW (ref 3.5–5.1)
Sodium: 132 mmol/L — ABNORMAL LOW (ref 135–145)
Total Bilirubin: 1 mg/dL (ref 0.3–1.2)
Total Protein: 5.9 g/dL — ABNORMAL LOW (ref 6.5–8.1)

## 2020-06-04 LAB — MAGNESIUM: Magnesium: 0.9 mg/dL — CL (ref 1.7–2.4)

## 2020-06-04 LAB — GLUCOSE, CAPILLARY
Glucose-Capillary: 142 mg/dL — ABNORMAL HIGH (ref 70–99)
Glucose-Capillary: 198 mg/dL — ABNORMAL HIGH (ref 70–99)
Glucose-Capillary: 163 mg/dL — ABNORMAL HIGH (ref 70–99)
Glucose-Capillary: 230 mg/dL — ABNORMAL HIGH (ref 70–99)

## 2020-06-04 LAB — RESPIRATORY PANEL BY RT PCR (FLU A&B, COVID)
Influenza A by PCR: NEGATIVE
Influenza B by PCR: NEGATIVE
SARS Coronavirus 2 by RT PCR: NEGATIVE

## 2020-06-04 LAB — HIV ANTIBODY (ROUTINE TESTING W REFLEX): HIV Screen 4th Generation wRfx: NONREACTIVE

## 2020-06-04 MED ORDER — SODIUM CHLORIDE 0.9 % IV SOLN: Freq: Once | INTRAVENOUS | Status: AC

## 2020-06-04 MED ORDER — ACETAMINOPHEN 650 MG RE SUPP: 650.0000 mg | Freq: Four times a day (QID) | RECTAL | Status: DC | PRN

## 2020-06-04 MED ORDER — INSULIN ASPART 100 UNIT/ML ~~LOC~~ SOLN
0.0000 [IU] | Freq: Three times a day (TID) | SUBCUTANEOUS | Status: DC
  Administered 2020-06-04: 2 [IU] via SUBCUTANEOUS
  Administered 2020-06-04 – 2020-06-05 (×3): 3 [IU] via SUBCUTANEOUS
  Administered 2020-06-05: 5 [IU] via SUBCUTANEOUS
  Administered 2020-06-06: 2 [IU] via SUBCUTANEOUS

## 2020-06-04 MED ORDER — ROPINIROLE HCL 1 MG PO TABS
2.0000 mg | ORAL_TABLET | Freq: Three times a day (TID) | ORAL | Status: DC
  Administered 2020-06-04 – 2020-06-06 (×7): 2 mg via ORAL
  Filled 2020-06-04 (×11): qty 2

## 2020-06-04 MED ORDER — CLINDAMYCIN PHOSPHATE 600 MG/50ML IV SOLN
600.0000 mg | Freq: Three times a day (TID) | INTRAVENOUS | Status: DC
  Administered 2020-06-04 – 2020-06-06 (×7): 600 mg via INTRAVENOUS
  Filled 2020-06-04 (×7): qty 50

## 2020-06-04 MED ORDER — MORPHINE SULFATE (PF) 2 MG/ML IV SOLN
2.0000 mg | INTRAVENOUS | Status: DC | PRN
  Administered 2020-06-04: 2 mg via INTRAVENOUS
  Filled 2020-06-04: qty 1

## 2020-06-04 MED ORDER — INSULIN ASPART 100 UNIT/ML ~~LOC~~ SOLN
0.0000 [IU] | Freq: Every day | SUBCUTANEOUS | Status: DC
  Administered 2020-06-04: 2 [IU] via SUBCUTANEOUS

## 2020-06-04 MED ORDER — ONDANSETRON HCL 4 MG/2ML IJ SOLN: 4.0000 mg | Freq: Four times a day (QID) | INTRAMUSCULAR | Status: DC | PRN

## 2020-06-04 MED ORDER — CARBIDOPA-LEVODOPA 25-100 MG PO TABS
2.0000 | ORAL_TABLET | Freq: Three times a day (TID) | ORAL | Status: DC
  Administered 2020-06-04 – 2020-06-06 (×7): 2 via ORAL
  Filled 2020-06-04 (×11): qty 2

## 2020-06-04 MED ORDER — POTASSIUM CHLORIDE 20 MEQ PO PACK
40.0000 meq | PACK | Freq: Once | ORAL | Status: AC
  Administered 2020-06-04: 40 meq via ORAL
  Filled 2020-06-04: qty 2

## 2020-06-04 MED ORDER — HEPARIN SODIUM (PORCINE) 5000 UNIT/ML IJ SOLN
5000.0000 [IU] | Freq: Three times a day (TID) | INTRAMUSCULAR | Status: DC
  Administered 2020-06-04 – 2020-06-06 (×7): 5000 [IU] via SUBCUTANEOUS
  Filled 2020-06-04 (×7): qty 1

## 2020-06-04 MED ORDER — SODIUM CHLORIDE 0.9 % IV SOLN: INTRAVENOUS | Status: DC

## 2020-06-04 MED ORDER — ONDANSETRON HCL 4 MG PO TABS: 4.0000 mg | ORAL_TABLET | Freq: Four times a day (QID) | ORAL | Status: DC | PRN

## 2020-06-04 MED ORDER — AMLODIPINE BESYLATE 5 MG PO TABS
10.0000 mg | ORAL_TABLET | Freq: Every evening | ORAL | Status: DC
  Administered 2020-06-04 – 2020-06-05 (×2): 10 mg via ORAL
  Filled 2020-06-04 (×2): qty 2

## 2020-06-04 MED ORDER — INFLUENZA VAC SPLIT QUAD 0.5 ML IM SUSY
0.5000 mL | PREFILLED_SYRINGE | INTRAMUSCULAR | Status: AC
  Administered 2020-06-06: 0.5 mL via INTRAMUSCULAR
  Filled 2020-06-04: qty 0.5

## 2020-06-04 MED ORDER — LEVOTHYROXINE SODIUM 50 MCG PO TABS
50.0000 ug | ORAL_TABLET | Freq: Every day | ORAL | Status: DC
  Administered 2020-06-04 – 2020-06-06 (×3): 50 ug via ORAL
  Filled 2020-06-04 (×3): qty 1

## 2020-06-04 MED ORDER — FENOFIBRATE 160 MG PO TABS
160.0000 mg | ORAL_TABLET | Freq: Every day | ORAL | Status: DC
  Administered 2020-06-04 – 2020-06-06 (×3): 160 mg via ORAL
  Filled 2020-06-04 (×5): qty 1

## 2020-06-04 MED ORDER — POLYETHYLENE GLYCOL 3350 17 G PO PACK: 17.0000 g | PACK | Freq: Every day | ORAL | Status: DC | PRN

## 2020-06-04 MED ORDER — ARIPIPRAZOLE 5 MG PO TABS
15.0000 mg | ORAL_TABLET | Freq: Two times a day (BID) | ORAL | Status: DC
  Administered 2020-06-04 – 2020-06-06 (×5): 15 mg via ORAL
  Filled 2020-06-04 (×5): qty 1

## 2020-06-04 MED ORDER — OMEGA-3-ACID ETHYL ESTERS 1 G PO CAPS
1.0000 | ORAL_CAPSULE | Freq: Four times a day (QID) | ORAL | Status: DC
  Administered 2020-06-04 – 2020-06-06 (×9): 1 g via ORAL
  Filled 2020-06-04 (×15): qty 1

## 2020-06-04 MED ORDER — OXYCODONE HCL 5 MG PO TABS
5.0000 mg | ORAL_TABLET | ORAL | Status: DC | PRN
  Administered 2020-06-04 – 2020-06-06 (×7): 5 mg via ORAL
  Filled 2020-06-04 (×7): qty 1

## 2020-06-04 MED ORDER — ADULT MULTIVITAMIN W/MINERALS CH
1.0000 | ORAL_TABLET | Freq: Every day | ORAL | Status: DC
  Administered 2020-06-04 – 2020-06-06 (×3): 1 via ORAL
  Filled 2020-06-04 (×3): qty 1

## 2020-06-04 MED ORDER — SUCRALFATE 1 G PO TABS
1.0000 g | ORAL_TABLET | Freq: Four times a day (QID) | ORAL | Status: DC
  Administered 2020-06-04 – 2020-06-06 (×9): 1 g via ORAL
  Filled 2020-06-04 (×9): qty 1

## 2020-06-04 MED ORDER — TRIHEXYPHENIDYL HCL 2 MG PO TABS
1.0000 mg | ORAL_TABLET | Freq: Three times a day (TID) | ORAL | Status: DC
  Administered 2020-06-04 – 2020-06-06 (×6): 1 mg via ORAL
  Filled 2020-06-04 (×12): qty 1

## 2020-06-04 MED ORDER — ACETAMINOPHEN 325 MG PO TABS
650.0000 mg | ORAL_TABLET | Freq: Four times a day (QID) | ORAL | Status: DC | PRN
  Administered 2020-06-05: 650 mg via ORAL
  Filled 2020-06-04: qty 2

## 2020-06-04 MED ORDER — PANTOPRAZOLE SODIUM 40 MG PO TBEC
40.0000 mg | DELAYED_RELEASE_TABLET | Freq: Two times a day (BID) | ORAL | Status: DC
  Administered 2020-06-04 – 2020-06-06 (×5): 40 mg via ORAL
  Filled 2020-06-04 (×5): qty 1

## 2020-06-04 MED ORDER — FAMOTIDINE 20 MG PO TABS
40.0000 mg | ORAL_TABLET | Freq: Every day | ORAL | Status: DC
  Administered 2020-06-04 – 2020-06-05 (×2): 40 mg via ORAL
  Filled 2020-06-04 (×3): qty 2

## 2020-06-04 MED ORDER — MAGNESIUM SULFATE 2 GM/50ML IV SOLN
2.0000 g | Freq: Once | INTRAVENOUS | Status: AC
  Administered 2020-06-04: 2 g via INTRAVENOUS
  Filled 2020-06-04: qty 50

## 2020-06-04 MED ORDER — ATORVASTATIN CALCIUM 40 MG PO TABS
80.0000 mg | ORAL_TABLET | Freq: Every day | ORAL | Status: DC
  Administered 2020-06-04 – 2020-06-06 (×3): 80 mg via ORAL
  Filled 2020-06-04 (×4): qty 2

## 2020-06-04 NOTE — ED Notes (Signed)
CRITICAL VALUE ALERT  Critical Value:  Magnesium 0.9  Date & Time Notied:  06/04/2020 0253  Provider Notified: Dr. Carren Rang  Orders Received/Actions taken: see chart

## 2020-06-04 NOTE — Progress Notes (Signed)
MD Bridges in to evaluate pt and wound. Old dressing and current packing removed, wound repacked by Dr. Henreitta Leber, covered with dry 4x4 guaze and secured with paper tape. Pt tolerated fair, required oral pain med post dressing change.

## 2020-06-04 NOTE — H&P (Addendum)
TRH H&P    Patient Demographics:    Steve Nunez, is a 54 y.o. male  MRN: [id number]  DOB - [birth year]  Admit Date - 06/03/2020  Referring MD/NP/PA: Wilkie AyeHorton  Outpatient Primary MD for the patient is Adela Glimpseranford, Tonya, NP  Patient coming from: Home  Chief complaint- abscess   HPI:    Steve Nunez  is a 54 y.o. male,with history of Parkinson's, HTN, HLD, GERD, DMII, CHF and congenital unilateral kidney presents to the ED with a c/c abscess. Patient reports that he has had perineal abscesses in the past and this feels like that. Patient reports that he first noticed a lump 5 days ago. It has been growing larger and more painful in the last 5 days. Patient reports that it doesn't have any drainage. Pain was 10/10 today, but 7/10 after dilaudid and drainage in the ED. Patient reports fever and chills associated. He is a diabetic and  Reports that his sugars have been well controlled. Patient reports no other concerns at this time.  In the ED T 100.8, HR 96, BP 133/74, R 17, 100% WBC 11.8 Na 130, K+ 3.3 BUN/Cr 36/1.80 CT pelvis - left perineum likely representing soft tissue infection. Scrotal wall thickening that is likely reactive, but could be cellulitis.  CXR = L basilar atelectasis Rocephin/Flagyl in the ED Dilaudid. 1L Bolus    Review of systems:    In addition to the HPI above,  Review of Systems  Constitutional: Positive for chills, fever and weight loss (intentional). Negative for malaise/fatigue.  HENT: Negative for congestion, sinus pain and sore throat.   Eyes: Negative for blurred vision, double vision, pain and discharge.  Respiratory: Positive for wheezing. Negative for cough and shortness of breath.   Cardiovascular: Negative for chest pain, palpitations and leg swelling.  Gastrointestinal: Negative for abdominal pain, constipation, diarrhea, nausea and vomiting.  Genitourinary: Negative for  dysuria, frequency and urgency.  Musculoskeletal: Negative for falls and myalgias.  Skin: Negative for rash.  Neurological: Negative for tingling, focal weakness, weakness and headaches.  Psychiatric/Behavioral: Negative for substance abuse.    All other systems reviewed and are negative.    Past History of the following :    Past Medical History:  Diagnosis Date  . Bipolar 1 disorder (HCC)   . CHF (congestive heart failure) (HCC)   . DDD (degenerative disc disease), cervical   . Depression   . Diabetes mellitus without complication (HCC)   . GERD (gastroesophageal reflux disease)   . Hypercholesterolemia   . Hyperlipidemia   . Hypertension   . only has 1 kidney    one kidney  . Parkinson's disease (HCC) 2010      Past Surgical History:  Procedure Laterality Date  . BIOPSY  05/13/2020   Procedure: BIOPSY;  Surgeon: Lanelle Balarver, Charles K, DO;  Location: [facility];  Service: Endoscopy;;  . BYPASS AXILLA/BRACHIAL ARTERY Right 1981  . CARPAL TUNNEL RELEASE Right 2010  . ESOPHAGOGASTRODUODENOSCOPY (EGD) WITH PROPOFOL N/A 05/13/2020   Procedure: ESOPHAGOGASTRODUODENOSCOPY (EGD) WITH PROPOFOL;  Surgeon: Lanelle Balarver, Charles K,  DO;  Location: [facility];  Service: Endoscopy;  Laterality: N/A;  12:30pm  . NECK SURGERY  2007, 2010, 2013   C3-C7 ant/post fusion, DDD  . TESTICLE REMOVAL Left 1981      Social History:      Social History   Tobacco Use  . Smoking status: Current Every Day Smoker    Packs/day: 0.50    Years: 30.00    Pack years: 15.00    Types: Cigarettes    Last attempt to quit: 08/31/2017    Years since quitting: 2.7  . Smokeless tobacco: Never Used  Substance Use Topics  . Alcohol use: No       Family History :     Family History  Problem Relation Age of Onset  . Hypertension Father       Home Medications:   Prior to Admission medications   Medication Sig Start Date End Date Taking? Authorizing Provider  acarbose (PRECOSE) 50 MG tablet Take  50 mg by mouth in the morning, at noon, and at bedtime. 10/10/19   [provider]  amLODipine (NORVASC) 10 MG tablet Take 10 mg every evening by mouth.  05/04/17   [provider]  ARIPiprazole (ABILIFY) 15 MG tablet Take 15 mg by mouth 2 (two) times daily.  06/13/19   [provider]  atorvastatin (LIPITOR) 80 MG tablet Take 80 mg by mouth daily.  06/03/17   [provider]  carbidopa-levodopa (SINEMET IR) 25-100 MG tablet Take 2.5 tablets by mouth in the morning, at noon, and at bedtime.  10/10/19   [provider]  famotidine (PEPCID) 40 MG tablet Take 40 mg by mouth daily. 04/17/20   [provider]  fenofibrate (TRICOR) 145 MG tablet Take 145 mg by mouth every evening.  01/14/15   [provider]  glimepiride (AMARYL) 1 MG tablet Take 1 mg by mouth daily.  01/14/15   [provider]  hydrochlorothiazide (HYDRODIURIL) 25 MG tablet Take 25 mg by mouth daily. 10/18/19   [provider]  levothyroxine (SYNTHROID, LEVOTHROID) 50 MCG tablet Take 50 mcg by mouth daily before breakfast.  11/29/14   [provider]  lisinopril (PRINIVIL,ZESTRIL) 20 MG tablet Take 20 mg by mouth in the morning. Take with 5 mg for a total of 25 mg daily 11/29/14   [provider]  lisinopril (PRINIVIL,ZESTRIL) 5 MG tablet Take 5 mg by mouth in the morning. Take with 20 mg for a total of 25 mg 01/04/18   [provider]  metFORMIN (GLUCOPHAGE) 500 MG tablet Take 1,000 mg by mouth 2 (two) times daily with a meal.  07/06/17   [provider]  nystatin ointment (MYCOSTATIN) Apply 1 application topically daily as needed for mouth pain. 04/24/20   [provider]  omega-3 acid ethyl esters (LOVAZA) 1 G capsule Take 1 capsule by mouth 4 (four) times daily. 02/01/15   [provider]  ondansetron (ZOFRAN-ODT) 4 MG disintegrating tablet Take 4 mg by mouth 2 (two) times daily as needed for nausea or vomiting.  04/23/20    [provider]  Oxycodone HCl 10 MG TABS Take 10 mg by mouth 3 (three) times daily as needed for moderate pain or severe pain.  04/23/20   [provider]  pantoprazole (PROTONIX) 40 MG tablet Take 1 tablet (40 mg total) by mouth 2 (two) times daily before a meal. 05/09/20   Anice Paganini, NP  rOPINIRole (REQUIP) 2 MG tablet Take 2 mg by  mouth 3 (three) times daily. 11/29/14   [provider]  sucralfate (CARAFATE) 1 g tablet Take 1 tablet (1 g total) by mouth 4 (four) times daily. 05/13/20 05/13/21  [name], DO  trihexyphenidyl (ARTANE) 2 MG tablet Take 1 mg by mouth 3 (three) times daily.  11/29/14   [provider]     Allergies:     Allergies  Allergen Reactions  . Depakote [Divalproex Sodium] Other (See Comments)    Panic attacks  . Sulfa Antibiotics Hives     Physical Exam:   Vitals  Blood pressure 90/65, pulse 88, temperature (!) 100.8 F (38.2 C), temperature source Oral, resp. rate 16, height  (1.702 m), weight 83.9 kg, SpO2 97 %.  1.  General: Laying supine in bed in no acute distress  2. Psychiatric: Mood and behavior are normal for situation  3. Neurologic: CN II-XII intact, moves all 4 extremities voluntarily  4. HEENMT:  Head is atraumatic, normocephalic, pupils are reactive, neck is supple, trachea is midline Mucous membranes are moist  5. Respiratory : Inspiratory wheezing in the lower lung fields BL No rhonchi or rales  6. Cardiovascular : HR normal, rhythm regular, no murmurs, rubs, or gallops  7. Gastrointestinal:  Abdomen is soft, non distended and non tender to palpation  8. Skin:  Perineum and scrotum are erythematous, incision site draining serosanguinous fluid  9.Musculoskeletal:  No acute deformity, no peripheral edema    Data Review:    CBC Recent Labs  Lab 06/03/20 2017  WBC 11.8*  HGB 12.0*  HCT 34.6*  PLT 191  MCV 98.9  MCH 34.3*  MCHC 34.7  RDW 13.2  LYMPHSABS 0.7  MONOABS  1.0  EOSABS 0.0  BASOSABS 0.0   ------------------------------------------------------------------------------------------------------------------  Results for orders placed or performed during the hospital encounter of 06/03/20 (from the past 48 hour(s))  Lactic acid, plasma     Status: None   Collection Time: 06/03/20  8:17 PM  Result Value Ref Range   Lactic Acid, Venous 0.9 0.5 - 1.9 mmol/L    Comment: Performed at [facility], [address] [place], [place] [address]  Comprehensive metabolic panel     Status: Abnormal   Collection Time: 06/03/20  8:17 PM  Result Value Ref Range   Sodium 130 (L) 135 - 145 mmol/L   Potassium 3.3 (L) 3.5 - 5.1 mmol/L   Chloride 92 (L) 98 - 111 mmol/L   CO2 26 22 - 32 mmol/L   Glucose, Bld 129 (H) 70 - 99 mg/dL    Comment: Glucose reference range applies only to samples taken after fasting for at least 8 hours.   BUN 36 (H) 6 - 20 mg/dL   Creatinine, Ser 9.14 (H) 0.61 - 1.24 mg/dL   Calcium 8.8 (L) 8.9 - 10.3 mg/dL   Total Protein 7.3 6.5 - 8.1 g/dL   Albumin 3.9 3.5 - 5.0 g/dL   AST 27 15 - 41 U/L   ALT 13 0 - 44 U/L   Alkaline Phosphatase 30 (L) 38 - 126 U/L   Total Bilirubin 1.5 (H) 0.3 - 1.2 mg/dL   GFR calc non Af Amer 42 (L) >60 mL/min   GFR calc Af Amer 49 (L) >60 mL/min   Anion gap 12 5 - 15    Comment: Performed at [facility], [address], [place], [place] [address]  CBC with Differential     Status: Abnormal   Collection Time: 06/03/20  8:17 PM  Result  Value Ref Range   WBC 11.8 (H) 4.0 - 10.5 K/uL   RBC 3.50 (L) 4.22 - 5.81 MIL/uL   Hemoglobin 12.0 (L) 13.0 - 17.0 g/dL   HCT 25.0 (L) 39 - 52 %   MCV 98.9 80.0 - 100.0 fL   MCH 34.3 (H) 26.0 - 34.0 pg   MCHC 34.7 30.0 - 36.0 g/dL   RDW 53.9 76.7 - 34.1 %   Platelets 191 150 - 400 K/uL   nRBC 0.0 0.0 - 0.2 %   Neutrophils Relative % 85 %   Neutro Abs 10.0 (H) 1.7 - 7.7 K/uL   Lymphocytes Relative 6 %   Lymphs Abs 0.7 0.7 - 4.0 K/uL   Monocytes Relative 8 %    Monocytes Absolute 1.0 0 - 1 K/uL   Eosinophils Relative 0 %   Eosinophils Absolute 0.0 0 - 0 K/uL   Basophils Relative 0 %   Basophils Absolute 0.0 0 - 0 K/uL   Immature Granulocytes 1 %   Abs Immature Granulocytes 0.08 (H) 0.00 - 0.07 K/uL    Comment: Performed at [facility], [address], [place], [place] [address]    Chemistries  Recent Labs  Lab 06/03/20 2017  NA 130*  K 3.3*  CL 92*  CO2 26  GLUCOSE 129*  BUN 36*  CREATININE 1.80*  CALCIUM 8.8*  AST 27  ALT 13  ALKPHOS 30*  BILITOT 1.5*   ------------------------------------------------------------------------------------------------------------------  ------------------------------------------------------------------------------------------------------------------ GFR: Estimated Creatinine Clearance: 49.1 mL/min (A) (by C-G formula based on SCr of 1.8 mg/dL (H)). Liver Function Tests: Recent Labs  Lab 06/03/20 2017  AST 27  ALT 13  ALKPHOS 30*  BILITOT 1.5*  PROT 7.3  ALBUMIN 3.9   No results for input(s): LIPASE, AMYLASE in the last 168 hours. No results for input(s): AMMONIA in the last 168 hours. Coagulation Profile: No results for input(s): INR, PROTIME in the last 168 hours. Cardiac Enzymes: No results for input(s): CKTOTAL, CKMB, CKMBINDEX, TROPONINI in the last 168 hours. BNP (last 3 results) No results for input(s): PROBNP in the last 8760 hours. HbA1C: No results for input(s): HGBA1C in the last 72 hours. CBG: No results for input(s): GLUCAP in the last 168 hours. Lipid Profile: No results for input(s): CHOL, HDL, LDLCALC, TRIG, CHOLHDL, LDLDIRECT in the last 72 hours. Thyroid Function Tests: No results for input(s): TSH, T4TOTAL, FREET4, T3FREE, THYROIDAB in the last 72 hours. Anemia Panel: No results for input(s): VITAMINB12, FOLATE, FERRITIN, TIBC, IRON, RETICCTPCT in the last 72  hours.  --------------------------------------------------------------------------------------------------------------- Urine analysis:    Component Value Date/Time   COLORURINE STRAW (A) 07/11/2017 1817   APPEARANCEUR CLEAR 07/11/2017 1817   LABSPEC 1.026 07/11/2017 1817   PHURINE 7.0 07/11/2017 1817   GLUCOSEU >=500 (A) 07/11/2017 1817   HGBUR NEGATIVE 07/11/2017 1817   BILIRUBINUR NEGATIVE 07/11/2017 1817   KETONESUR NEGATIVE 07/11/2017 1817   PROTEINUR NEGATIVE 07/11/2017 1817   NITRITE NEGATIVE 07/11/2017 1817   LEUKOCYTESUR NEGATIVE 07/11/2017 1817      Imaging Results:    CT PELVIS WO CONTRAST  Result Date: 06/04/2020 CLINICAL DATA:  Abscess x5 days. EXAM: CT PELVIS WITHOUT CONTRAST TECHNIQUE: Multidetector CT imaging of the pelvis was performed following the standard protocol without intravenous contrast. COMPARISON:  October 31, 2019.  04/29/2020 FINDINGS: Urinary Tract:  The urinary bladder is distended. Bowel:  Unremarkable visualized pelvic bowel loops. Vascular/Lymphatic: There are atherosclerotic changes of the abdominal aorta. There are enlarged pelvic and inguinal lymph nodes, likely reactive. Reproductive:  No mass or other significant abnormality Other: There are inflammatory changes in the patient's left perineum. There appears to be packing material within the left gluteal cleft. There is no definite well-formed drainable fluid collection. There is scrotal wall thickening, left worse than right. Musculoskeletal: No suspicious bone lesions identified. IMPRESSION: 1. Inflammatory changes within the left perineum likely representing a soft tissue infection. There is hyperdense material within the left gluteal cleft favored to represent packing material, perhaps from a prior incision and drainage. Correlation with the patient's history is recommended. At this time, there is no well-formed drainable fluid collection. 2. Scrotal wall thickening, likely reactive. However, cellulitis  can have a similar appearance and should be correlated with physical exam. 3. Enlarged inguinal and pelvic lymph nodes, likely reactive. 4.  Aortic Atherosclerosis (ICD10-I70.0). Electronically Signed   By: Katherine Mantle M.D.   On: 06/04/2020 00:11   DG Chest Portable 1 View  Result Date: 06/03/2020 CLINICAL DATA:  Initial evaluation for possible sepsis, fever. History of CHF. EXAM: PORTABLE CHEST 1 VIEW COMPARISON:  Prior radiograph from 02/09/2015. FINDINGS: Patient is rotated to the right. Cardiac and mediastinal silhouettes within normal limits. Lungs normally inflated. Minimal streaky left basilar subsegmental atelectasis. No focal infiltrates. No edema or effusion. No pneumothorax. No acute osseous findings. Postsurgical changes partially visualize within the cervical spine. IMPRESSION: Minimal streaky left basilar subsegmental atelectasis. No other active cardiopulmonary disease identified. Electronically Signed   By: Rise Mu M.D.   On: 06/03/2020 20:33      Assessment & Plan:    Active Problems:   AKI (acute kidney injury) (HCC)   1. AKI 1. Baseline Cr 1.32 2. Today Cr 1.80 3. Congenital unilateral kidney 4. Continue IV fluids 5. Avoid nephrotoxic agents when possible. 6. Recheck in the AM 2. Recurrent perineal abscess 1. CT pelvis shows left perineum likely representing soft tissue infection, scrotal wall thickening likely reactive, but possibly cellulitis, no drain-able collection of fluid 2. Start clindamycin 3. T 100.8, HR 96, BP stable 4. Tylenol for fever 5. Rocephin and flagyl in the ED 6. 1L Bolus NS  7. Dilaudid for pain 3. DMII 1. Hgb A1C pending 2. Hold oral hypoglycemics 3. Sliding scale coverage  4. Hypokalemia 1. Replace and recheck    DVT Prophylaxis-   Heparin - SCDs   AM Labs Ordered, also please review Full Orders  Family Communication: No family at bedside  Code Status: Full code  Admission status: Observation  Time spent in  minutes : 64   Oberon Hehir B Zierle-Ghosh DO

## 2020-06-04 NOTE — Progress Notes (Signed)
Subjective: Patient admitted this morning, see detailed H&P by Dr Netta Neat 54 year old male with a history of Parkinson disease, hypertension, hyperlipidemia, GERD, diabetes mellitus type 2, congenital unilateral kidney came to ED with complaints of buttock abscess.  Patient has had multiple perineal abscesses in the past.  Patient underwent incision and drainage in the ED. also started on IV clindamycin.  Vitals:   06/04/20 0741 06/04/20 1000  BP: 109/68 114/69  Pulse: 83 84  Resp: 18 13  Temp: 97.8 F (36.6 C) 98.1 F (36.7 C)  SpO2: 98% 100%      A/P Perirectal abscess Acute kidney injury Diabetes mellitus type 2 Hypokalemia Hypomagnesemia  Patient has packing in place, will exchange packing.  Continue IV clindamycin.  Consult general surgery for further recommendations. Will replace magnesium and follow serum magnesium level in a.m. Continue sliding scale insulin with NovoLog. Continue IVNormal saline at 100 mL/h    Meredeth Ide [facility] Pager- [phone]

## 2020-06-04 NOTE — ED Provider Notes (Signed)
  Patient signed out pending CT scan.  Patient with recurrent perirectal abscess.  Febrile with leukocytosis and AKI.  CT scan shows no drainable fluid collection.  Previous I&D with packing in place.  On recheck, patient remains febrile 100.8.  He reports chills.  He generally feels like he does not feel very well.  Given his subjective sepsis criteria unknown source with evidence of cellulitis tracking towards the scrotum, feel is reasonable to admit for hydration and IV antibiotics to make sure he improves.  Patient is agreeable to plan.  Physical Exam  BP 133/74 (BP Location: Right Arm)   Pulse 96   Temp (!) 100.8 F (38.2 C) (Oral)   Resp 17   Ht 1.702 m (5\' 7" )   Wt 83.9 kg   SpO2 100%   BMI 28.98 kg/m   Problem List Items Addressed This Visit    None    Visit Diagnoses    Perirectal abscess    -  Primary   Sepsis with acute renal failure without septic shock, due to unspecified organism, unspecified acute renal failure type (HCC)       Relevant Medications   cefTRIAXone (ROCEPHIN) 1 g in sodium chloride 0.9 % 100 mL IVPB (Completed)   metroNIDAZOLE (FLAGYL) IVPB 500 mg (Completed)        Oseas Detty, , MD 06/04/20 [phone]

## 2020-06-04 NOTE — Consult Note (Signed)
[facility] Consult  Reason for Consult: Perirectal abscess  Referring Physician:  Dr. Sharl Ma   Chief Complaint    Abscess      HPI: Steve Nunez is a 54 y.o. 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 He reports that he has had one prior I&D and was seen by a surgeon but did not require any further treatment. He says that when he gets the abscesses he usually get some cellulitis and swelling but this time the cellulitis went in to the pubic area.  He says that he has a remote history of being in the hospital and getting fluids and being told he had heart failure but that he had a ECHO that did not demonstrate this after the fact. He does not have any other cardiac history and does not follow with a cardiologist.   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 He had an I&D in the Ed and was admitted for the cellulitis. He denies history of IBD or Crohn's. He denies any blood or mucus per rectum or family history of IBD. He had a colonoscopy in the past that was normal.   Past Medical History:  Diagnosis Date  . Bipolar 1 disorder (HCC)   . CHF (congestive heart failure) (HCC)   . DDD (degenerative disc disease), cervical   . Depression   . Diabetes mellitus without complication (HCC)   . GERD (gastroesophageal reflux disease)   . Hypercholesterolemia   . Hyperlipidemia   . Hypertension   . only has 1 kidney    one kidney  . Parkinson's disease (HCC) 2010    Past Surgical History:  Procedure Laterality Date  . BIOPSY  05/13/2020   Procedure: BIOPSY;  Surgeon: [name], DO;  Location: [facility];  Service: Endoscopy;;  . BYPASS AXILLA/BRACHIAL ARTERY Right 1981  . CARPAL TUNNEL RELEASE Right 2010  . ESOPHAGOGASTRODUODENOSCOPY (EGD) WITH PROPOFOL N/A 05/13/2020   Procedure: ESOPHAGOGASTRODUODENOSCOPY (EGD) WITH PROPOFOL;  Surgeon: [name], DO;   Location: [facility];  Service: Endoscopy;  Laterality: N/A;  12:30pm  . NECK SURGERY  2007, 2010, 2013   C3-C7 ant/post fusion, DDD  . TESTICLE REMOVAL Left 1981    Family History  Problem Relation Age of Onset  . Hypertension Father     Social History   Tobacco Use  . Smoking status: Current Every Day Smoker    Packs/day: 0.50    Years: 30.00    Pack years: 15.00    Types: Cigarettes    Last attempt to quit: 08/31/2017    Years since quitting: 2.7  . Smokeless tobacco: Never Used  Vaping Use  . Vaping Use: Never used  Substance Use Topics  . Alcohol use: No  . Drug use: No    Medications: I have reviewed the patient's current medications. Current Facility-Administered Medications  Medication Dose Route Frequency Provider Last Rate Last Admin  . 0.9 %  sodium chloride infusion   Intravenous Continuous Zierle-Ghosh, Asia B, DO   Paused at 06/04/20 0518  . acetaminophen (TYLENOL) tablet 650 mg  650 mg Oral Q6H PRN Zierle-Ghosh, Asia B, DO       Or  . acetaminophen (TYLENOL) suppository 650 mg  650 mg Rectal Q6H PRN Zierle-Ghosh, Asia B, DO      . amLODipine (NORVASC) tablet 10 mg  10 mg Oral QPM  Zierle-Ghosh, Asia B, DO      . ARIPiprazole (ABILIFY) tablet 15 mg  15 mg Oral BID Zierle-Ghosh, Asia B, DO   15 mg at 06/04/20 1005  . atorvastatin (LIPITOR) tablet 80 mg  80 mg Oral Daily Zierle-Ghosh, Asia B, DO   80 mg at 06/04/20 1005  . carbidopa-levodopa (SINEMET IR) 25-100 MG per tablet immediate release 2 tablet  2 tablet Oral TID Zierle-Ghosh, Asia B, DO   2 tablet at 06/04/20 1005  . clindamycin (CLEOCIN) IVPB 600 mg  600 mg Intravenous Q8H Zierle-Ghosh, Asia B, DO 100 mL/hr at 06/04/20 0518 600 mg at 06/04/20 0518  . famotidine (PEPCID) tablet 40 mg  40 mg Oral Daily Zierle-Ghosh, Asia B, DO   40 mg at 06/04/20 1006  . fenofibrate tablet 160 mg  160 mg Oral Daily Zierle-Ghosh, Asia B, DO   160 mg at 06/04/20 1005  . heparin injection 5,000 Units  5,000 Units  Subcutaneous Q8H Zierle-Ghosh, Asia B, DO   5,000 Units at 06/04/20 0518  . [START ON 06/05/2020] influenza vac split quadrivalent PF (FLUARIX) injection 0.5 mL  0.5 mL Intramuscular Tomorrow-1000 Zierle-Ghosh, Asia B, DO      . insulin aspart (novoLOG) injection 0-15 Units  0-15 Units Subcutaneous TID WC Zierle-Ghosh, Asia B, DO   3 Units at 06/04/20 1318  . insulin aspart (novoLOG) injection 0-5 Units  0-5 Units Subcutaneous QHS Zierle-Ghosh, Asia B, DO      . levothyroxine (SYNTHROID) tablet 50 mcg  50 mcg Oral QAC breakfast Zierle-Ghosh, Asia B, DO   50 mcg at 06/04/20 0751  . morphine 2 MG/ML injection 2 mg  2 mg Intravenous Q2H PRN Zierle-Ghosh, Asia B, DO      . multivitamin with minerals tablet 1 tablet  1 tablet Oral Daily Zierle-Ghosh, Asia B, DO   1 tablet at 06/04/20 1005  . omega-3 acid ethyl esters (LOVAZA) capsule 1 g  1 capsule Oral QID Zierle-Ghosh, Asia B, DO   1 g at 06/04/20 1005  . ondansetron (ZOFRAN) tablet 4 mg  4 mg Oral Q6H PRN Zierle-Ghosh, Asia B, DO       Or  . ondansetron (ZOFRAN) injection 4 mg  4 mg Intravenous Q6H PRN Zierle-Ghosh, Asia B, DO      . oxyCODONE (Oxy IR/ROXICODONE) immediate release tablet 5 mg  5 mg Oral Q4H PRN Zierle-Ghosh, Asia B, DO   5 mg at 06/04/20 1318  . pantoprazole (PROTONIX) EC tablet 40 mg  40 mg Oral BID AC Zierle-Ghosh, Asia B, DO   40 mg at 06/04/20 0751  . polyethylene glycol (MIRALAX / GLYCOLAX) packet 17 g  17 g Oral Daily PRN Zierle-Ghosh, Asia B, DO      . potassium chloride (KLOR-CON) packet 40 mEq  40 mEq Oral Once [place], Sarina Ill, MD      . rOPINIRole (REQUIP) tablet 2 mg  2 mg Oral TID Zierle-Ghosh, Asia B, DO   2 mg at 06/04/20 1005  . sucralfate (CARAFATE) tablet 1 g  1 g Oral QID Meredeth Ide, MD   1 g at 06/04/20 1010  . trihexyphenidyl (ARTANE) tablet 1 mg  1 mg Oral TID Zierle-Ghosh, Asia B, DO   1 mg at 06/04/20 1010    Allergies  Allergen Reactions  . Depakote [Divalproex Sodium] Other (See Comments)    Panic  attacks  . Sulfa Antibiotics Hives     ROS:  A comprehensive review of systems was negative except for: Gastrointestinal: positive  for perianal pain and drainage, redness and swelling scrotum  Blood pressure 114/69, pulse 84, temperature 98.1 F (36.7 C), temperature source Oral, resp. rate 13, height 5\' 7"  (1.702 m), weight 88.3 kg, SpO2 100 %. Physical Exam Vitals reviewed. Exam conducted with a chaperone present.  HENT:     Head: Normocephalic.     Nose: Nose normal.  Eyes:     Extraocular Movements: Extraocular movements intact.  Cardiovascular:     Rate and Rhythm: Normal rate.  Pulmonary:     Effort: Pulmonary effort is normal.  Abdominal:     General: There is no distension.     Palpations: Abdomen is soft.     Tenderness: There is no abdominal tenderness.  Genitourinary:    Comments: Packing removed, left anterior perianal region with incision, drainage, some pits from prior abscesses, tender, scrotum red and swollen, erythema shrinking from marks this AM at border, no bullae or signs of necrotizing infection at this time  Musculoskeletal:        General: Normal range of motion.     Cervical back: Normal range of motion.  Skin:    General: Skin is warm.  Neurological:     General: No focal deficit present.     Mental Status: He is alert and oriented to person, place, and time.  Psychiatric:        Mood and Affect: Mood normal.        Behavior: Behavior normal.        Thought Content: Thought content normal.     Results: Results for orders placed or performed during the hospital encounter of 06/03/20 (from the past 48 hour(s))  Lactic acid, plasma     Status: None   Collection Time: 06/03/20  8:17 PM  Result Value Ref Range   Lactic Acid, Venous 0.9 0.5 - 1.9 mmol/L    Comment: Performed at [facility], [address] [place], [place]  Comprehensive metabolic panel     Status: Abnormal   Collection Time: 06/03/20  8:17 PM  Result Value Ref Range    Sodium 130 (L) 135 - 145 mmol/L   Potassium 3.3 (L) 3.5 - 5.1 mmol/L   Chloride 92 (L) 98 - 111 mmol/L   CO2 26 22 - 32 mmol/L   Glucose, Bld 129 (H) 70 - 99 mg/dL    Comment: Glucose reference range applies only to samples taken after fasting for at least 8 hours.   BUN 36 (H) 6 - 20 mg/dL   Creatinine, Ser 08/03/20 (H) 0.61 - 1.24 mg/dL   Calcium 8.8 (L) 8.9 - 10.3 mg/dL   Total Protein 7.3 6.5 - 8.1 g/dL   Albumin 3.9 3.5 - 5.0 g/dL   AST 27 15 - 41 U/L   ALT 13 0 - 44 U/L   Alkaline Phosphatase 30 (L) 38 - 126 U/L   Total Bilirubin 1.5 (H) 0.3 - 1.2 mg/dL   GFR calc non Af Amer 42 (L) >60 mL/min   GFR calc Af Amer 49 (L) >60 mL/min   Anion gap 12 5 - 15    Comment: Performed at [facility], [address], [place], [place]  CBC with Differential     Status: Abnormal   Collection Time: 06/03/20  8:17 PM  Result Value Ref Range   WBC 11.8 (H) 4.0 - 10.5 K/uL   RBC 3.50 (L) 4.22 - 5.81 MIL/uL   Hemoglobin 12.0 (L) 13.0 - 17.0 g/dL   HCT 08/03/20 (  L) 39 - 52 %   MCV 98.9 80.0 - 100.0 fL   MCH 34.3 (H) 26.0 - 34.0 pg   MCHC 34.7 30.0 - 36.0 g/dL   RDW 40.913.2 81.111.5 - 91.415.5 %   Platelets 191 150 - 400 K/uL   nRBC 0.0 0.0 - 0.2 %   Neutrophils Relative % 85 %   Neutro Abs 10.0 (H) 1.7 - 7.7 K/uL   Lymphocytes Relative 6 %   Lymphs Abs 0.7 0.7 - 4.0 K/uL   Monocytes Relative 8 %   Monocytes Absolute 1.0 0 - 1 K/uL   Eosinophils Relative 0 %   Eosinophils Absolute 0.0 0 - 0 K/uL   Basophils Relative 0 %   Basophils Absolute 0.0 0 - 0 K/uL   Immature Granulocytes 1 %   Abs Immature Granulocytes 0.08 (H) 0.00 - 0.07 K/uL    Comment: Performed at [facility], [address] [place], [place] [address]  HIV Antibody (routine testing w rflx)     Status: None   Collection Time: 06/03/20  8:17 PM  Result Value Ref Range   HIV Screen 4th Generation wRfx Non Reactive Non Reactive    Comment: Performed at [facility], [address] [place], [place] [address]  Respiratory  Panel by RT PCR (Flu A&B, Covid) - Nasopharyngeal Swab     Status: None   Collection Time: 06/04/20  1:06 AM   Specimen: Nasopharyngeal Swab  Result Value Ref Range   SARS Coronavirus 2 by RT PCR NEGATIVE NEGATIVE    Comment: (NOTE) SARS-CoV-2 target nucleic acids are NOT DETECTED.  The SARS-CoV-2 RNA is generally detectable in upper respiratoy specimens during the acute phase of infection. The lowest concentration of SARS-CoV-2 viral copies this assay can detect is 131 copies/mL. A negative result does not preclude SARS-Cov-2 infection and should not be used as the sole basis for treatment or other patient management decisions. A negative result may occur with  improper specimen collection/handling, submission of specimen other than nasopharyngeal swab, presence of viral mutation(s) within the areas targeted by this assay, and inadequate number of viral copies (<131 copies/mL). A negative result must be combined with clinical observations, patient history, and epidemiological information. The expected result is Negative.  Fact Sheet for Patients:  https://www.moore.com/https://www.fda.gov/media/142436/download  Fact Sheet for Healthcare Providers:  https://www.young.biz/https://www.fda.gov/media/142435/download  This test is no t yet approved or cleared by the [place] FDA and  has been authorized for detection and/or diagnosis of SARS-CoV-2 by FDA under an Emergency Use Authorization (EUA). This EUA will remain  in effect (meaning this test can be used) for the duration of the COVID-19 declaration under Section 564(b)(1) of the Act, 21 U.S.C. section 360bbb-3(b)(1), unless the authorization is terminated or revoked sooner.     Influenza A by PCR NEGATIVE NEGATIVE   Influenza B by PCR NEGATIVE NEGATIVE    Comment: (NOTE) The Xpert Xpress SARS-CoV-2/FLU/RSV assay is intended as an aid in  the diagnosis of influenza from Nasopharyngeal swab specimens and  should not be used as a sole basis for treatment. Nasal  washings and  aspirates are unacceptable for Xpert Xpress SARS-CoV-2/FLU/RSV  testing.  Fact Sheet for Patients: https://www.moore.com/https://www.fda.gov/media/142436/download  Fact Sheet for Healthcare Providers: https://www.young.biz/https://www.fda.gov/media/142435/download  This test is not yet approved or cleared by the [place] FDA and  has been authorized for detection and/or diagnosis of SARS-CoV-2 by  FDA under an Emergency Use Authorization (EUA). This EUA will remain  in effect (meaning this test can  be used) for the duration of the  Covid-19 declaration under Section 564(b)(1) of the Act, 21  U.S.C. section 360bbb-3(b)(1), unless the authorization is  terminated or revoked. Performed at [facility], [address], [place], [place] [address]   Culture, blood (routine x 2)     Status: None (Preliminary result)   Collection Time: 06/04/20  1:58 AM   Specimen: BLOOD  Result Value Ref Range   Specimen Description BLOOD LEFT ANTECUBITAL    Special Requests      BOTTLES DRAWN AEROBIC AND ANAEROBIC Blood Culture adequate volume   Culture      NO GROWTH < 12 HOURS Performed at [facility], [address] [place], [place] [address]    Report Status PENDING   Comprehensive metabolic panel     Status: Abnormal   Collection Time: 06/04/20  2:08 AM  Result Value Ref Range   Sodium 132 (L) 135 - 145 mmol/L   Potassium 3.2 (L) 3.5 - 5.1 mmol/L   Chloride 98 98 - 111 mmol/L   CO2 25 22 - 32 mmol/L   Glucose, Bld 107 (H) 70 - 99 mg/dL    Comment: Glucose reference range applies only to samples taken after fasting for at least 8 hours.   BUN 32 (H) 6 - 20 mg/dL   Creatinine, Ser 0.98 (H) 0.61 - 1.24 mg/dL   Calcium 7.8 (L) 8.9 - 10.3 mg/dL   Total Protein 5.9 (L) 6.5 - 8.1 g/dL   Albumin 3.1 (L) 3.5 - 5.0 g/dL   AST 21 15 - 41 U/L   ALT 13 0 - 44 U/L   Alkaline Phosphatase 24 (L) 38 - 126 U/L   Total Bilirubin 1.0 0.3 - 1.2 mg/dL   GFR calc non Af Amer 52 (L) >60 mL/min   GFR calc Af Amer >60 >60 mL/min    Anion gap 9 5 - 15    Comment: Performed at [facility], [address] [place], [place] [address]  Magnesium     Status: Abnormal   Collection Time: 06/04/20  2:08 AM  Result Value Ref Range   Magnesium 0.9 (LL) 1.7 - 2.4 mg/dL    Comment: CRITICAL RESULT CALLED TO, READ BACK BY AND VERIFIED WITH: M DOSS,RN@[id number]  06/04/20 MKELLY Performed at [facility], [address], [place], [place] [address]   CBC WITH DIFFERENTIAL     Status: Abnormal   Collection Time: 06/04/20  2:08 AM  Result Value Ref Range   WBC 8.8 4.0 - 10.5 K/uL   RBC 2.93 (L) 4.22 - 5.81 MIL/uL   Hemoglobin 10.3 (L) 13.0 - 17.0 g/dL   HCT 62.1 (L) 39 - 52 %   MCV 98.6 80.0 - 100.0 fL   MCH 35.2 (H) 26.0 - 34.0 pg   MCHC 35.6 30.0 - 36.0 g/dL   RDW 30.8 65.7 - 84.6 %   Platelets 159 150 - 400 K/uL   nRBC 0.0 0.0 - 0.2 %   Neutrophils Relative % 81 %   Neutro Abs 7.1 1.7 - 7.7 K/uL   Lymphocytes Relative 11 %   Lymphs Abs 1.0 0.7 - 4.0 K/uL   Monocytes Relative 7 %   Monocytes Absolute 0.7 0 - 1 K/uL   Eosinophils Relative 0 %   Eosinophils Absolute 0.0 0 - 0 K/uL   Basophils Relative 0 %   Basophils Absolute 0.0 0 - 0 K/uL   Immature Granulocytes 1 %   Abs Immature Granulocytes 0.04 0.00 - 0.07 K/uL  Comment: Performed at [facility], [address], [place], [place] [address]  Culture, blood (routine x 2)     Status: None (Preliminary result)   Collection Time: 06/04/20  2:08 AM   Specimen: BLOOD LEFT HAND  Result Value Ref Range   Specimen Description BLOOD LEFT HAND    Special Requests      BOTTLES DRAWN AEROBIC AND ANAEROBIC Blood Culture adequate volume   Culture      NO GROWTH < 12 HOURS Performed at [facility], [address], [place], [place] [address]    Report Status PENDING   Urinalysis, Routine w reflex microscopic Urine, Random     Status: None   Collection Time: 06/04/20  7:00 AM  Result Value Ref Range   Color, Urine YELLOW YELLOW   APPearance CLEAR CLEAR   Specific Gravity, Urine  1.009 1.005 - 1.030   pH 5.0 5.0 - 8.0   Glucose, UA NEGATIVE NEGATIVE mg/dL   Hgb urine dipstick NEGATIVE NEGATIVE   Bilirubin Urine NEGATIVE NEGATIVE   Ketones, ur NEGATIVE NEGATIVE mg/dL   Protein, ur NEGATIVE NEGATIVE mg/dL   Nitrite NEGATIVE NEGATIVE   Leukocytes,Ua NEGATIVE NEGATIVE    Comment: Performed at [facility], [address], [place], [place] [address]  Glucose, capillary     Status: Abnormal   Collection Time: 06/04/20  7:50 AM  Result Value Ref Range   Glucose-Capillary 142 (H) 70 - 99 mg/dL    Comment: Glucose reference range applies only to samples taken after fasting for at least 8 hours.  Glucose, capillary     Status: Abnormal   Collection Time: 06/04/20 11:15 AM  Result Value Ref Range   Glucose-Capillary 198 (H) 70 - 99 mg/dL    Comment: Glucose reference range applies only to samples taken after fasting for at least 8 hours.   Personally reviewed- packed I&D site, thickening of the scrotum, no fluid collection, no gas tracking  CT PELVIS WO CONTRAST  Result Date: 06/04/2020 CLINICAL DATA:  Abscess x5 days. EXAM: CT PELVIS WITHOUT CONTRAST TECHNIQUE: Multidetector CT imaging of the pelvis was performed following the standard protocol without intravenous contrast. COMPARISON:  October 31, 2019.  04/29/2020 FINDINGS: Urinary Tract:  The urinary bladder is distended. Bowel:  Unremarkable visualized pelvic bowel loops. Vascular/Lymphatic: There are atherosclerotic changes of the abdominal aorta. There are enlarged pelvic and inguinal lymph nodes, likely reactive. Reproductive:  No mass or other significant abnormality Other: There are inflammatory changes in the patient's left perineum. There appears to be packing material within the left gluteal cleft. There is no definite well-formed drainable fluid collection. There is scrotal wall thickening, left worse than right. Musculoskeletal: No suspicious bone lesions identified. IMPRESSION: 1. Inflammatory changes within the left  perineum likely representing a soft tissue infection. There is hyperdense material within the left gluteal cleft favored to represent packing material, perhaps from a prior incision and drainage. Correlation with the patient's history is recommended. At this time, there is no well-formed drainable fluid collection. 2. Scrotal wall thickening, likely reactive. However, cellulitis can have a similar appearance and should be correlated with physical exam. 3. Enlarged inguinal and pelvic lymph nodes, likely reactive. 4.  Aortic Atherosclerosis (ICD10-I70.0). Electronically Signed   By: Katherine Mantle M.D.   On: 06/04/2020 00:11   DG Chest Portable 1 View  Result Date: 06/03/2020 CLINICAL DATA:  Initial evaluation for possible sepsis, fever. History of CHF. EXAM: PORTABLE CHEST 1 VIEW COMPARISON:  Prior radiograph from 02/09/2015. FINDINGS: Patient is  rotated to the right. Cardiac and mediastinal silhouettes within normal limits. Lungs normally inflated. Minimal streaky left basilar subsegmental atelectasis. No focal infiltrates. No edema or effusion. No pneumothorax. No acute osseous findings. Postsurgical changes partially visualize within the cervical spine. IMPRESSION: Minimal streaky left basilar subsegmental atelectasis. No other active cardiopulmonary disease identified. Electronically Signed   By: Rise Mu M.D.   On: 06/03/2020 20:33     Assessment & Plan:  Steve Nunez is a 54 y.o. male with perianal abscess that is recurrent. 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 He also has associated cellulitis in to the scrotum. He does not show signs of a necrotizing infection at this time. Cellulitis is improving.  -IV antibiotics for cellulitis  -Packing BID with packing tape  -Discussed need for outpatient follow up for potential exam under anesthesia to ensure no fistula in ano   All questions were answered to the satisfaction of the  patient.    Lucretia Roers 06/04/2020, 12:34 PM

## 2020-06-05 DIAGNOSIS — K611 Rectal abscess: Secondary | ICD-10-CM | POA: Diagnosis not present

## 2020-06-05 LAB — GLUCOSE, CAPILLARY
Glucose-Capillary: 117 mg/dL — ABNORMAL HIGH (ref 70–99)
Glucose-Capillary: 157 mg/dL — ABNORMAL HIGH (ref 70–99)
Glucose-Capillary: 227 mg/dL — ABNORMAL HIGH (ref 70–99)
Glucose-Capillary: 116 mg/dL — ABNORMAL HIGH (ref 70–99)

## 2020-06-05 LAB — BASIC METABOLIC PANEL
Anion gap: 8 (ref 5–15)
BUN: 16 mg/dL (ref 6–20)
CO2: 26 mmol/L (ref 22–32)
Calcium: 8.1 mg/dL — ABNORMAL LOW (ref 8.9–10.3)
Chloride: 104 mmol/L (ref 98–111)
Creatinine, Ser: 1.16 mg/dL (ref 0.61–1.24)
GFR calc non Af Amer: >60 mL/min (ref >60)
Glucose, Bld: 108 mg/dL — ABNORMAL HIGH (ref 70–99)
Potassium: 3.8 mmol/L (ref 3.5–5.1)
Sodium: 138 mmol/L (ref 135–145)

## 2020-06-05 LAB — MAGNESIUM: Magnesium: 1.6 mg/dL — ABNORMAL LOW (ref 1.7–2.4)

## 2020-06-05 MED ORDER — MAGNESIUM SULFATE 2 GM/50ML IV SOLN
2.0000 g | Freq: Once | INTRAVENOUS | Status: AC
  Administered 2020-06-05: 2 g via INTRAVENOUS
  Filled 2020-06-05: qty 50

## 2020-06-05 MED ORDER — CLOTRIMAZOLE 1 % EX CREA
TOPICAL_CREAM | Freq: Two times a day (BID) | CUTANEOUS | Status: DC
  Filled 2020-06-05: qty 15

## 2020-06-05 NOTE — Plan of Care (Signed)

## 2020-06-05 NOTE — Progress Notes (Signed)
PROGRESS NOTE    Jeananne Ramarley G Perret III  ZOX:[id number] DOB: [birth year] DOA: 06/03/2020 PCP: Adela Glimpseranford, Tonya, NP   Brief Narrative:  This 54 year old male with history of Parkinson disease, hypertension, hyperlipidemia, GERD, diabetes mellitus type 2, congenital unilateral kidney came to ED with complaints of buttock abscess.  Patient has had multiple perineal abscesses in the past.  Patient underwent incision and drainage in the ED. He was started on IV clindamycin.  General surgery was consulted,  Patient is making significant improvement with regards to cellulitis.  Assessment & Plan:   Active Problems:   AKI (acute kidney injury) (HCC)   Perirectal abscess   Perirectal Abscess / S/P Incision and drainage:  Patient found to have perirectal abscess on CT abdomen which seems nondrainable.  General surgery consulted.  Continue IV clindamycin, Cellulitis is significantly improving.  General surgery recommended Patient can be discharged home tomorrow on p.o. antibiotic either Augmentin or clindamycin for next 10 days.  Continue packing, Wife needs to be taught packing.  Acute kidney injury: >>>> Resolved Renal function back to baseline.  DM 2: Hold home diabetic medications, regular insulin sliding scale.  HTN: Continue Home BP medications.  Hypokalemia : replete and improved.  Hypomagnesemia: replete, recheck am labs.   DVT prophylaxis: Heparin sq Code Status: Full Family Communication: No one at bed side.  Disposition Plan: Status is: Inpatient  Remains inpatient appropriate because:Inpatient level of care appropriate due to severity of illness   Dispo: The patient is from: Home              Anticipated d/c is to: Home              Anticipated d/c date is: 1 day              Patient currently is not medically stable to d/c.   Consultants:   General surgery  Procedures:  Antimicrobials:  Anti-infectives (From admission, onward)   Start     Dose/Rate Route Frequency  Ordered Stop   06/04/20 0145  clindamycin (CLEOCIN) IVPB 600 mg        600 mg 100 mL/hr over 30 Minutes Intravenous Every 8 hours 06/04/20 0141     06/03/20 2145  cefTRIAXone (ROCEPHIN) 1 g in sodium chloride 0.9 % 100 mL IVPB        1 g 200 mL/hr over 30 Minutes Intravenous  Once 06/03/20 2131 06/03/20 2257   06/03/20 2145  metroNIDAZOLE (FLAGYL) IVPB 500 mg        500 mg 100 mL/hr over 60 Minutes Intravenous  Once 06/03/20 2131 06/03/20 2258     Subjective: Patient was seen and examined at bedside.  Overnight events noted.  Patient reports cellulitis has improved.  He still has significant swelling and erythema noted around scrotum  Objective: Vitals:   06/04/20 2010 06/04/20 2022 06/05/20 0519 06/05/20 0700  BP: 111/66  104/71 112/70  Pulse: 85  67 75  Resp: 16  18 17   Temp: 98.3 F (36.8 C)  98.1 F (36.7 C) 97.8 F (36.6 C)  TempSrc: Oral  Oral Oral  SpO2: 97% 96% 97% 96%  Weight:      Height:        Intake/Output Summary (Last 24 hours) at 06/05/2020 1409 Last data filed at 06/05/2020 1100 Gross per 24 hour  Intake 2296.88 ml  Output 3920 ml  Net -1623.12 ml   Filed Weights   06/03/20 1743 06/04/20 0328  Weight: 83.9 kg 88.3 kg  Examination:  General exam: Appears calm and comfortable. Respiratory system: Clear to auscultation. Respiratory effort normal. Cardiovascular system: S1 & S2 heard, RRR. No JVD, murmurs, rubs, gallops or clicks. No pedal edema. Gastrointestinal system: Abdomen is nondistended, soft and nontender. No organomegaly or masses felt. Normal bowel sounds heard. Central nervous system: Alert and oriented. No focal neurological deficits. Extremities: No edema, no cyanosis, no clubbing. Skin: erythema, swelling, tenderness noted around scrotum. Psychiatry: Judgement and insight appear normal. Mood & affect appropriate.     Data Reviewed: I have personally reviewed following labs and imaging studies  CBC: Recent Labs  Lab 06/03/20 2017  06/04/20 0208  WBC 11.8* 8.8  NEUTROABS 10.0* 7.1  HGB 12.0* 10.3*  HCT 34.6* 28.9*  MCV 98.9 98.6  PLT 191 159   Basic Metabolic Panel: Recent Labs  Lab 06/03/20 2017 06/04/20 0208 06/05/20 0554  NA 130* 132* 138  K 3.3* 3.2* 3.8  CL 92* 98 104  CO2 26 25 26   GLUCOSE 129* 107* 108*  BUN 36* 32* 16  CREATININE 1.80* 1.50* 1.16  CALCIUM 8.8* 7.8* 8.1*  MG  --  0.9* 1.6*   GFR: Estimated Creatinine Clearance: 78.1 mL/min (by C-G formula based on SCr of 1.16 mg/dL). Liver Function Tests: Recent Labs  Lab 06/03/20 2017 06/04/20 0208  AST 27 21  ALT 13 13  ALKPHOS 30* 24*  BILITOT 1.5* 1.0  PROT 7.3 5.9*  ALBUMIN 3.9 3.1*   No results for input(s): LIPASE, AMYLASE in the last 168 hours. No results for input(s): AMMONIA in the last 168 hours. Coagulation Profile: No results for input(s): INR, PROTIME in the last 168 hours. Cardiac Enzymes: No results for input(s): CKTOTAL, CKMB, CKMBINDEX, TROPONINI in the last 168 hours. BNP (last 3 results) No results for input(s): PROBNP in the last 8760 hours. HbA1C: No results for input(s): HGBA1C in the last 72 hours. CBG: Recent Labs  Lab 06/04/20 1115 06/04/20 1653 06/04/20 2012 06/05/20 0804 06/05/20 1142  GLUCAP 198* 163* 230* 117* 157*   Lipid Profile: No results for input(s): CHOL, HDL, LDLCALC, TRIG, CHOLHDL, LDLDIRECT in the last 72 hours. Thyroid Function Tests: No results for input(s): TSH, T4TOTAL, FREET4, T3FREE, THYROIDAB in the last 72 hours. Anemia Panel: No results for input(s): VITAMINB12, FOLATE, FERRITIN, TIBC, IRON, RETICCTPCT in the last 72 hours. Sepsis Labs: Recent Labs  Lab 06/03/20 2017  LATICACIDVEN 0.9    Recent Results (from the past 240 hour(s))  Respiratory Panel by RT PCR (Flu A&B, Covid) - Nasopharyngeal Swab     Status: None   Collection Time: 06/04/20  1:06 AM   Specimen: Nasopharyngeal Swab  Result Value Ref Range Status   SARS Coronavirus 2 by RT PCR NEGATIVE NEGATIVE  Final    Comment: (NOTE) SARS-CoV-2 target nucleic acids are NOT DETECTED.  The SARS-CoV-2 RNA is generally detectable in upper respiratoy specimens during the acute phase of infection. The lowest concentration of SARS-CoV-2 viral copies this assay can detect is 131 copies/mL. A negative result does not preclude SARS-Cov-2 infection and should not be used as the sole basis for treatment or other patient management decisions. A negative result may occur with  improper specimen collection/handling, submission of specimen other than nasopharyngeal swab, presence of viral mutation(s) within the areas targeted by this assay, and inadequate number of viral copies (<131 copies/mL). A negative result must be combined with clinical observations, patient history, and epidemiological information. The expected result is Negative.  Fact Sheet for Patients:  08/04/20  Fact  Sheet for Healthcare Providers:  https://www.young.biz/  This test is no t yet approved or cleared by the [place] FDA and  has been authorized for detection and/or diagnosis of SARS-CoV-2 by FDA under an Emergency Use Authorization (EUA). This EUA will remain  in effect (meaning this test can be used) for the duration of the COVID-19 declaration under Section 564(b)(1) of the Act, 21 U.S.C. section 360bbb-3(b)(1), unless the authorization is terminated or revoked sooner.     Influenza A by PCR NEGATIVE NEGATIVE Final   Influenza B by PCR NEGATIVE NEGATIVE Final    Comment: (NOTE) The Xpert Xpress SARS-CoV-2/FLU/RSV assay is intended as an aid in  the diagnosis of influenza from Nasopharyngeal swab specimens and  should not be used as a sole basis for treatment. Nasal washings and  aspirates are unacceptable for Xpert Xpress SARS-CoV-2/FLU/RSV  testing.  Fact Sheet for Patients: https://www.moore.com/  Fact Sheet for Healthcare  Providers: https://www.young.biz/  This test is not yet approved or cleared by the [place] FDA and  has been authorized for detection and/or diagnosis of SARS-CoV-2 by  FDA under an Emergency Use Authorization (EUA). This EUA will remain  in effect (meaning this test can be used) for the duration of the  Covid-19 declaration under Section 564(b)(1) of the Act, 21  U.S.C. section 360bbb-3(b)(1), unless the authorization is  terminated or revoked. Performed at [facility], [address], [place], [place] [address]   Culture, blood (routine x 2)     Status: None (Preliminary result)   Collection Time: 06/04/20  1:58 AM   Specimen: BLOOD  Result Value Ref Range Status   Specimen Description BLOOD LEFT ANTECUBITAL  Final   Special Requests   Final    BOTTLES DRAWN AEROBIC AND ANAEROBIC Blood Culture adequate volume   Culture   Final    NO GROWTH 1 DAY Performed at [facility], [address], [place], [place] [address]    Report Status PENDING  Incomplete  Culture, blood (routine x 2)     Status: None (Preliminary result)   Collection Time: 06/04/20  2:08 AM   Specimen: BLOOD LEFT HAND  Result Value Ref Range Status   Specimen Description BLOOD LEFT HAND  Final   Special Requests   Final    BOTTLES DRAWN AEROBIC AND ANAEROBIC Blood Culture adequate volume   Culture   Final    NO GROWTH 1 DAY Performed at [facility], [address] [place], [place] [address]    Report Status PENDING  Incomplete    Radiology Studies: CT PELVIS WO CONTRAST  Result Date: 06/04/2020 CLINICAL DATA:  Abscess x5 days. EXAM: CT PELVIS WITHOUT CONTRAST TECHNIQUE: Multidetector CT imaging of the pelvis was performed following the standard protocol without intravenous contrast. COMPARISON:  October 31, 2019.  04/29/2020 FINDINGS: Urinary Tract:  The urinary bladder is distended. Bowel:  Unremarkable visualized pelvic bowel loops. Vascular/Lymphatic: There are atherosclerotic changes of  the abdominal aorta. There are enlarged pelvic and inguinal lymph nodes, likely reactive. Reproductive:  No mass or other significant abnormality Other: There are inflammatory changes in the patient's left perineum. There appears to be packing material within the left gluteal cleft. There is no definite well-formed drainable fluid collection. There is scrotal wall thickening, left worse than right. Musculoskeletal: No suspicious bone lesions identified. IMPRESSION: 1. Inflammatory changes within the left perineum likely representing a soft tissue infection. There is hyperdense material within the left gluteal cleft favored to represent packing material, perhaps from a  prior incision and drainage. Correlation with the patient's history is recommended. At this time, there is no well-formed drainable fluid collection. 2. Scrotal wall thickening, likely reactive. However, cellulitis can have a similar appearance and should be correlated with physical exam. 3. Enlarged inguinal and pelvic lymph nodes, likely reactive. 4.  Aortic Atherosclerosis (ICD10-I70.0). Electronically Signed   By: Katherine Mantle M.D.   On: 06/04/2020 00:11   DG Chest Portable 1 View  Result Date: 06/03/2020 CLINICAL DATA:  Initial evaluation for possible sepsis, fever. History of CHF. EXAM: PORTABLE CHEST 1 VIEW COMPARISON:  Prior radiograph from 02/09/2015. FINDINGS: Patient is rotated to the right. Cardiac and mediastinal silhouettes within normal limits. Lungs normally inflated. Minimal streaky left basilar subsegmental atelectasis. No focal infiltrates. No edema or effusion. No pneumothorax. No acute osseous findings. Postsurgical changes partially visualize within the cervical spine. IMPRESSION: Minimal streaky left basilar subsegmental atelectasis. No other active cardiopulmonary disease identified. Electronically Signed   By: Rise Mu M.D.   On: 06/03/2020 20:33    Scheduled Meds: . amLODipine  10 mg Oral QPM  .  ARIPiprazole  15 mg Oral BID  . atorvastatin  80 mg Oral Daily  . carbidopa-levodopa  2 tablet Oral TID  . famotidine  40 mg Oral Daily  . fenofibrate  160 mg Oral Daily  . heparin  5,000 Units Subcutaneous Q8H  . influenza vac split quadrivalent PF  0.5 mL Intramuscular Tomorrow-1000  . insulin aspart  0-15 Units Subcutaneous TID WC  . insulin aspart  0-5 Units Subcutaneous QHS  . levothyroxine  50 mcg Oral QAC breakfast  . multivitamin with minerals  1 tablet Oral Daily  . omega-3 acid ethyl esters  1 capsule Oral QID  . pantoprazole  40 mg Oral BID AC  . rOPINIRole  2 mg Oral TID  . sucralfate  1 g Oral QID  . trihexyphenidyl  1 mg Oral TID   Continuous Infusions: . sodium chloride 100 mL/hr at 06/05/20 0535  . clindamycin (CLEOCIN) IV 600 mg (06/05/20 0536)     LOS: 1 day    Time spent: 25 mins    Trenten Watchman, MD [facility]   If 7PM-7AM, please contact night-coverage

## 2020-06-05 NOTE — Progress Notes (Signed)
[facility]  Feeling less tender. Packing done last night.   Cellulitis in the scrotum and left groin improving, less tender and less swollen, dressing in place over packing in the left buttock region  Would keep another 24 hr for IV antibiotics and transition to oral to complete 10 day course. Continue BID packing to the I&D site. Wife needs to be taught how to pack.  I will see next week in clinic 10/14 to check on him.  Steve Greenhouse, MD [facility] [address] [place], [place] [address] [phone] (office)

## 2020-06-06 DIAGNOSIS — N179 Acute kidney failure, unspecified: Secondary | ICD-10-CM | POA: Diagnosis not present

## 2020-06-06 DIAGNOSIS — K611 Rectal abscess: Secondary | ICD-10-CM | POA: Diagnosis not present

## 2020-06-06 LAB — CBC
HCT: 30.4 % — ABNORMAL LOW (ref 39.0–52.0)
Hemoglobin: 10.2 g/dL — ABNORMAL LOW (ref 13.0–17.0)
MCH: 33.9 pg (ref 26.0–34.0)
MCHC: 33.6 g/dL (ref 30.0–36.0)
MCV: 101 fL — ABNORMAL HIGH (ref 80.0–100.0)
Platelets: 202 10*3/uL (ref 150–400)
RBC: 3.01 MIL/uL — ABNORMAL LOW (ref 4.22–5.81)
RDW: 13.5 % (ref 11.5–15.5)
WBC: 5 10*3/uL (ref 4.0–10.5)
nRBC: 0 % (ref 0.0–0.2)

## 2020-06-06 LAB — GLUCOSE, CAPILLARY
Glucose-Capillary: 123 mg/dL — ABNORMAL HIGH (ref 70–99)
Glucose-Capillary: 129 mg/dL — ABNORMAL HIGH (ref 70–99)

## 2020-06-06 LAB — BASIC METABOLIC PANEL
Anion gap: 6 (ref 5–15)
BUN: 16 mg/dL (ref 6–20)
CO2: 25 mmol/L (ref 22–32)
Calcium: 8.2 mg/dL — ABNORMAL LOW (ref 8.9–10.3)
Chloride: 107 mmol/L (ref 98–111)
Creatinine, Ser: 1.15 mg/dL (ref 0.61–1.24)
GFR calc non Af Amer: >60 mL/min (ref >60)
Glucose, Bld: 142 mg/dL — ABNORMAL HIGH (ref 70–99)
Potassium: 3.8 mmol/L (ref 3.5–5.1)
Sodium: 138 mmol/L (ref 135–145)

## 2020-06-06 LAB — MAGNESIUM: Magnesium: 1.7 mg/dL (ref 1.7–2.4)

## 2020-06-06 LAB — PHOSPHORUS: Phosphorus: 2 mg/dL — ABNORMAL LOW (ref 2.5–4.6)

## 2020-06-06 MED ORDER — K PHOS MONO-SOD PHOS DI & MONO 155-852-130 MG PO TABS
250.0000 mg | ORAL_TABLET | Freq: Three times a day (TID) | ORAL | Status: DC
  Administered 2020-06-06: 250 mg via ORAL
  Filled 2020-06-06: qty 1

## 2020-06-06 MED ORDER — AMOXICILLIN-POT CLAVULANATE 875-125 MG PO TABS: 1.0000 | ORAL_TABLET | Freq: Two times a day (BID) | ORAL | 0 refills | Status: DC

## 2020-06-06 NOTE — Discharge Summary (Signed)
Physician Discharge Summary  Steve Nunez ZOX:[id number] DOB: Dec 20, 1965 DOA: 06/03/2020  PCP: Adela Glimpseranford, Tonya, NP  Admit date: 06/03/2020.  Discharge date: 06/06/2020.  Admitted From: Home.  Disposition:  Home  Recommendations for Outpatient Follow-up:  1. Follow up with PCP in 1-2 weeks. 2. Please obtain BMP/CBC in one week. 3. Advised to take Augmentin 875 twice a day for 7 more days to complete 10-day course for perirectal abscess. 4. Wife was advised to do packing twice a day. 5. Cellulitis has significantly improved. 6. Advised to follow-up with general surgery on October 14,  appointment has been made.  Home Health: None. Equipment/Devices: None.  Discharge Condition: Stable CODE STATUS:Full code Diet recommendation: Heart Healthy  [facility]: This 54 year old male with history of Parkinson disease, hypertension, hyperlipidemia, GERD, diabetes mellitus type 2, congenital unilateral kidney came to ED with complaints of buttock abscess. Patient has had multiple perineal abscesses in the past. Patient underwent incision and drainage of the abscess in the ED. Patient was admitted for perirectal abscess, he was started on IV clindamycin.  General surgery was consulted,  Patient was managed with supportive care, warm compresses, IV fluids, IV Antibiotics,  pain control, Patient has made significant improvement,  redness has significantly improved,  cellulitis has  improved.  Patient had developed AKI which resolved with iv fluids.   Patient was cleared from general surgery to be discharged.  Patient is being discharged on Augmentin 875 twice a day twice daily for 7 more days to complete 10-day course.  Wife was taught how to do packing twice daily, follow up appointment has been made with general surgery on October 14.  He was managed for below problems.  Discharge Diagnoses:  Active Problems:   AKI (acute kidney injury) (HCC)   Perirectal  abscess  Perirectal Abscess / S/P Incision and drainage:  Patient was found to have perirectal abscess on CT abdomen . He underwent incision and drainage in the ED.  General surgery consulted. Continue IV clindamycin, Cellulitis is significantly improving.  General surgery recommended,  Patient can be discharged home on p.o. antibiotic either Augmentin or clindamycin for next 7 days.  Continue packing, Wife needs to be taught packing.  Acute kidney injury: >>>> Resolved. Renal function back to baseline.  DM 2: Hold home diabetic medications, regular insulin sliding scale.  HTN: Continue Home BP medications.  Hypokalemia : replete and improved.  Hypomagnesemia:  Improved. replete, recheck am labs.   Discharge Instructions  Discharge Instructions    Call MD for:  difficulty breathing, headache or visual disturbances   Complete by: As directed    Call MD for:  persistant dizziness or light-headedness   Complete by: As directed    Call MD for:  persistant nausea and vomiting   Complete by: As directed    Call MD for:  redness, tenderness, or signs of infection (pain, swelling, redness, odor or green/yellow discharge around incision site)   Complete by: As directed    Diet - low sodium heart healthy   Complete by: As directed    Diet Carb Modified   Complete by: As directed    Discharge instructions   Complete by: As directed    Advised to follow-up with primary care physician in 1 week. Advised to take Augmentin 875 twice a day for 7 more days to complete 10-day course for perirectal abscess. Wife was advised to do packing twice a day. Cellulitis is significantly improved. Advised to follow-up with general surgery on  August 14 appointment has been made.   Discharge wound care:   Complete by: As directed    Wife was taught how to do packing and dressing   Increase activity slowly   Complete by: As directed      Allergies as of 06/06/2020      Reactions   Depakote  [divalproex Sodium] Other (See Comments)   Panic attacks   Sulfa Antibiotics Hives      Medication List    TAKE these medications   acarbose 50 MG tablet Commonly known as: PRECOSE Take 50 mg by mouth in the morning, at noon, and at bedtime.   amLODipine 10 MG tablet Commonly known as: NORVASC Take 10 mg every evening by mouth.   amoxicillin-clavulanate 875-125 MG tablet Commonly known as: Augmentin Take 1 tablet by mouth 2 (two) times daily for 7 days.   ARIPiprazole 15 MG tablet Commonly known as: ABILIFY Take 15 mg by mouth 2 (two) times daily.   atorvastatin 80 MG tablet Commonly known as: LIPITOR Take 80 mg by mouth daily.   carbidopa-levodopa 25-100 MG tablet Commonly known as: SINEMET IR Take 2.5 tablets by mouth in the morning, at noon, and at bedtime.   fenofibrate 145 MG tablet Commonly known as: TRICOR Take 145 mg by mouth every evening.   glimepiride 1 MG tablet Commonly known as: AMARYL Take 1 mg by mouth daily.   hydrochlorothiazide 25 MG tablet Commonly known as: HYDRODIURIL Take 25 mg by mouth daily.   levothyroxine 50 MCG tablet Commonly known as: SYNTHROID Take 50 mcg by mouth daily before breakfast.   lisinopril 20 MG tablet Commonly known as: ZESTRIL Take 20 mg by mouth in the morning. Take with 5 mg for a total of 25 mg daily   lisinopril 5 MG tablet Commonly known as: ZESTRIL Take 5 mg by mouth in the morning. Take with 20 mg for a total of 25 mg   metFORMIN 500 MG tablet Commonly known as: GLUCOPHAGE Take 1,000 mg by mouth 2 (two) times daily with a meal.   nystatin ointment Commonly known as: MYCOSTATIN Apply 1 application topically daily as needed for mouth pain.   omega-3 acid ethyl esters 1 g capsule Commonly known as: LOVAZA Take 1 capsule by mouth 4 (four) times daily.   ondansetron 4 MG disintegrating tablet Commonly known as: ZOFRAN-ODT Take 4 mg by mouth 2 (two) times daily as needed for nausea or vomiting.    Oxycodone HCl 10 MG Tabs Take 10 mg by mouth 3 (three) times daily as needed for moderate pain or severe pain.   pantoprazole 40 MG tablet Commonly known as: Protonix Take 1 tablet (40 mg total) by mouth 2 (two) times daily before a meal.   rOPINIRole 2 MG tablet Commonly known as: REQUIP Take 2 mg by mouth 3 (three) times daily.   sucralfate 1 g tablet Commonly known as: Carafate Take 1 tablet (1 g total) by mouth 4 (four) times daily.   trihexyphenidyl 2 MG tablet Commonly known as: ARTANE Take 1 mg by mouth 3 (three) times daily.            Discharge Care Instructions  (From admission, onward)         Start     Ordered   06/06/20 0000  Discharge wound care:       Comments: Wife was taught how to do packing and dressing   06/06/20 1153          Follow-up Information  Lucretia Roers, MD Follow up on 06/13/2020.   Specialty: General Surgery Contact information: [address] [facility] [address] [phone]        Adela Glimpse, NP Follow up in 1 week(s).   Specialty: Family Medicine Contact information: [address] [place] [address] [phone]              Allergies  Allergen Reactions  . Depakote [Divalproex Sodium] Other (See Comments)    Panic attacks  . Sulfa Antibiotics Hives    Consultations:  General surgery.   Procedures/Studies: CT PELVIS WO CONTRAST  Result Date: 06/04/2020 CLINICAL DATA:  Abscess x5 days. EXAM: CT PELVIS WITHOUT CONTRAST TECHNIQUE: Multidetector CT imaging of the pelvis was performed following the standard protocol without intravenous contrast. COMPARISON:  October 31, 2019.  04/29/2020 FINDINGS: Urinary Tract:  The urinary bladder is distended. Bowel:  Unremarkable visualized pelvic bowel loops. Vascular/Lymphatic: There are atherosclerotic changes of the abdominal aorta. There are enlarged pelvic and inguinal lymph nodes, likely reactive. Reproductive:  No mass or other  significant abnormality Other: There are inflammatory changes in the patient's left perineum. There appears to be packing material within the left gluteal cleft. There is no definite well-formed drainable fluid collection. There is scrotal wall thickening, left worse than right. Musculoskeletal: No suspicious bone lesions identified. IMPRESSION: 1. Inflammatory changes within the left perineum likely representing a soft tissue infection. There is hyperdense material within the left gluteal cleft favored to represent packing material, perhaps from a prior incision and drainage. Correlation with the patient's history is recommended. At this time, there is no well-formed drainable fluid collection. 2. Scrotal wall thickening, likely reactive. However, cellulitis can have a similar appearance and should be correlated with physical exam. 3. Enlarged inguinal and pelvic lymph nodes, likely reactive. 4.  Aortic Atherosclerosis (ICD10-I70.0). Electronically Signed   By: Katherine Mantle M.D.   On: 06/04/2020 00:11   DG Chest Portable 1 View  Result Date: 06/03/2020 CLINICAL DATA:  Initial evaluation for possible sepsis, fever. History of CHF. EXAM: PORTABLE CHEST 1 VIEW COMPARISON:  Prior radiograph from 02/09/2015. FINDINGS: Patient is rotated to the right. Cardiac and mediastinal silhouettes within normal limits. Lungs normally inflated. Minimal streaky left basilar subsegmental atelectasis. No focal infiltrates. No edema or effusion. No pneumothorax. No acute osseous findings. Postsurgical changes partially visualize within the cervical spine. IMPRESSION: Minimal streaky left basilar subsegmental atelectasis. No other active cardiopulmonary disease identified. Electronically Signed   By: Rise Mu M.D.   On: 06/03/2020 20:33    Incision and drainage of perirectal abscess   Subjective: Patient was seen and examined at bedside.  Overnight events noted.  Cellulitis has significantly improved in the  scrotal area. Swelling and redness has significantly improved.  Patient reports feeling better and wants to be discharged.  Discharge Exam: Vitals:   06/06/20 0519 06/06/20 0829  BP: 117/79 121/74  Pulse: (!) 56 66  Resp: 18 18  Temp: 97.6 F (36.4 C) 98 F (36.7 C)  SpO2: 99% 98%   Vitals:   06/05/20 0700 06/05/20 2037 06/06/20 0519 06/06/20 0829  BP: 112/70 105/66 117/79 121/74  Pulse: 75 73 (!) 56 66  Resp: 17  18 18   Temp: 97.8 F (36.6 C) 98.1 F (36.7 C) 97.6 F (36.4 C) 98 F (36.7 C)  TempSrc: Oral Oral Oral Oral  SpO2: 96% 97% 99% 98%  Weight:      Height:        General: Pt is  alert, awake, not in acute distress Cardiovascular: RRR, S1/S2 +, no rubs, no gallops Respiratory: CTA bilaterally, no wheezing, no rhonchi Abdominal: Soft, NT, ND, bowel sounds + Extremities: no edema, no cyanosis. Skin: Redness, swelling improved.    The results of significant diagnostics from this hospitalization (including imaging, microbiology, ancillary and laboratory) are listed below for reference.     Microbiology: Recent Results (from the past 240 hour(s))  Respiratory Panel by RT PCR (Flu A&B, Covid) - Nasopharyngeal Swab     Status: None   Collection Time: 06/04/20  1:06 AM   Specimen: Nasopharyngeal Swab  Result Value Ref Range Status   SARS Coronavirus 2 by RT PCR NEGATIVE NEGATIVE Final    Comment: (NOTE) SARS-CoV-2 target nucleic acids are NOT DETECTED.  The SARS-CoV-2 RNA is generally detectable in upper respiratoy specimens during the acute phase of infection. The lowest concentration of SARS-CoV-2 viral copies this assay can detect is 131 copies/mL. A negative result does not preclude SARS-Cov-2 infection and should not be used as the sole basis for treatment or other patient management decisions. A negative result may occur with  improper specimen collection/handling, submission of specimen other than nasopharyngeal swab, presence of viral mutation(s)  within the areas targeted by this assay, and inadequate number of viral copies (<131 copies/mL). A negative result must be combined with clinical observations, patient history, and epidemiological information. The expected result is Negative.  Fact Sheet for Patients:  https://www.moore.com/  Fact Sheet for Healthcare Providers:  https://www.young.biz/  This test is no t yet approved or cleared by the [place] FDA and  has been authorized for detection and/or diagnosis of SARS-CoV-2 by FDA under an Emergency Use Authorization (EUA). This EUA will remain  in effect (meaning this test can be used) for the duration of the COVID-19 declaration under Section 564(b)(1) of the Act, 21 U.S.C. section 360bbb-3(b)(1), unless the authorization is terminated or revoked sooner.     Influenza A by PCR NEGATIVE NEGATIVE Final   Influenza B by PCR NEGATIVE NEGATIVE Final    Comment: (NOTE) The Xpert Xpress SARS-CoV-2/FLU/RSV assay is intended as an aid in  the diagnosis of influenza from Nasopharyngeal swab specimens and  should not be used as a sole basis for treatment. Nasal washings and  aspirates are unacceptable for Xpert Xpress SARS-CoV-2/FLU/RSV  testing.  Fact Sheet for Patients: https://www.moore.com/  Fact Sheet for Healthcare Providers: https://www.young.biz/  This test is not yet approved or cleared by the [place] FDA and  has been authorized for detection and/or diagnosis of SARS-CoV-2 by  FDA under an Emergency Use Authorization (EUA). This EUA will remain  in effect (meaning this test can be used) for the duration of the  Covid-19 declaration under Section 564(b)(1) of the Act, 21  U.S.C. section 360bbb-3(b)(1), unless the authorization is  terminated or revoked. Performed at [facility], [address], [place], [place] [address]   Culture, blood (routine x 2)     Status: None  (Preliminary result)   Collection Time: 06/04/20  1:58 AM   Specimen: BLOOD  Result Value Ref Range Status   Specimen Description BLOOD LEFT ANTECUBITAL  Final   Special Requests   Final    BOTTLES DRAWN AEROBIC AND ANAEROBIC Blood Culture adequate volume   Culture   Final    NO GROWTH 2 DAYS Performed at [facility], [address], [place], [place] [address]    Report Status PENDING  Incomplete  Culture, blood (routine x 2)  Status: None (Preliminary result)   Collection Time: 06/04/20  2:08 AM   Specimen: BLOOD LEFT HAND  Result Value Ref Range Status   Specimen Description BLOOD LEFT HAND  Final   Special Requests   Final    BOTTLES DRAWN AEROBIC AND ANAEROBIC Blood Culture adequate volume   Culture   Final    NO GROWTH 2 DAYS Performed at [facility], [address] [place], [place] [address]    Report Status PENDING  Incomplete     Labs: BNP (last 3 results) No results for input(s): BNP in the last 8760 hours. Basic Metabolic Panel: Recent Labs  Lab 06/03/20 2017 06/04/20 0208 06/05/20 0554 06/06/20 0559  NA 130* 132* 138 138  K 3.3* 3.2* 3.8 3.8  CL 92* 98 104 107  CO2 GLUCOSE 129* 107* 108* 142*  BUN 36* 32* 16 16  CREATININE 1.80* 1.50* 1.16 1.15  CALCIUM 8.8* 7.8* 8.1* 8.2*  MG  --  0.9* 1.6* 1.7  PHOS  --   --   --  2.0*   Liver Function Tests: Recent Labs  Lab 06/03/20 2017 06/04/20 0208  AST 27 21  ALT 13 13  ALKPHOS 30* 24*  BILITOT 1.5* 1.0  PROT 7.3 5.9*  ALBUMIN 3.9 3.1*   No results for input(s): LIPASE, AMYLASE in the last 168 hours. No results for input(s): AMMONIA in the last 168 hours. CBC: Recent Labs  Lab 06/03/20 2017 06/04/20 0208 06/06/20 0559  WBC 11.8* 8.8 5.0  NEUTROABS 10.0* 7.1  --   HGB 12.0* 10.3* 10.2*  HCT 34.6* 28.9* 30.4*  MCV 98.9 98.6 101.0*  PLT 191 159 202   Cardiac Enzymes: No results for input(s): CKTOTAL, CKMB, CKMBINDEX, TROPONINI in the last 168 hours. BNP: Invalid input(s):  POCBNP CBG: Recent Labs  Lab 06/05/20 1142 06/05/20 1829 06/05/20 2134 06/06/20 0734 06/06/20 1140  GLUCAP 157* 227* 116* 123* 129*   D-Dimer No results for input(s): DDIMER in the last 72 hours. Hgb A1c No results for input(s): HGBA1C in the last 72 hours. Lipid Profile No results for input(s): CHOL, HDL, LDLCALC, TRIG, CHOLHDL, LDLDIRECT in the last 72 hours. Thyroid function studies No results for input(s): TSH, T4TOTAL, T3FREE, THYROIDAB in the last 72 hours.  Invalid input(s): FREET3 Anemia work up No results for input(s): VITAMINB12, FOLATE, FERRITIN, TIBC, IRON, RETICCTPCT in the last 72 hours. Urinalysis    Component Value Date/Time   COLORURINE YELLOW 06/04/2020 0700   APPEARANCEUR CLEAR 06/04/2020 0700   LABSPEC 1.009 06/04/2020 0700   PHURINE 5.0 06/04/2020 0700   GLUCOSEU NEGATIVE 06/04/2020 0700   HGBUR NEGATIVE 06/04/2020 0700   BILIRUBINUR NEGATIVE 06/04/2020 0700   KETONESUR NEGATIVE 06/04/2020 0700   PROTEINUR NEGATIVE 06/04/2020 0700   NITRITE NEGATIVE 06/04/2020 0700   LEUKOCYTESUR NEGATIVE 06/04/2020 0700   Sepsis Labs Invalid input(s): PROCALCITONIN,  WBC,  LACTICIDVEN Microbiology Recent Results (from the past 240 hour(s))  Respiratory Panel by RT PCR (Flu A&B, Covid) - Nasopharyngeal Swab     Status: None   Collection Time: 06/04/20  1:06 AM   Specimen: Nasopharyngeal Swab  Result Value Ref Range Status   SARS Coronavirus 2 by RT PCR NEGATIVE NEGATIVE Final    Comment: (NOTE) SARS-CoV-2 target nucleic acids are NOT DETECTED.  The SARS-CoV-2 RNA is generally detectable in upper respiratoy specimens during the acute phase of infection. The lowest concentration of SARS-CoV-2 viral copies this assay can detect is 131 copies/mL. A negative result does  not preclude SARS-Cov-2 infection and should not be used as the sole basis for treatment or other patient management decisions. A negative result may occur with  improper specimen  collection/handling, submission of specimen other than nasopharyngeal swab, presence of viral mutation(s) within the areas targeted by this assay, and inadequate number of viral copies (<131 copies/mL). A negative result must be combined with clinical observations, patient history, and epidemiological information. The expected result is Negative.  Fact Sheet for Patients:  https://www.moore.com/  Fact Sheet for Healthcare Providers:  https://www.young.biz/  This test is no t yet approved or cleared by the [place] FDA and  has been authorized for detection and/or diagnosis of SARS-CoV-2 by FDA under an Emergency Use Authorization (EUA). This EUA will remain  in effect (meaning this test can be used) for the duration of the COVID-19 declaration under Section 564(b)(1) of the Act, 21 U.S.C. section 360bbb-3(b)(1), unless the authorization is terminated or revoked sooner.     Influenza A by PCR NEGATIVE NEGATIVE Final   Influenza B by PCR NEGATIVE NEGATIVE Final    Comment: (NOTE) The Xpert Xpress SARS-CoV-2/FLU/RSV assay is intended as an aid in  the diagnosis of influenza from Nasopharyngeal swab specimens and  should not be used as a sole basis for treatment. Nasal washings and  aspirates are unacceptable for Xpert Xpress SARS-CoV-2/FLU/RSV  testing.  Fact Sheet for Patients: https://www.moore.com/  Fact Sheet for Healthcare Providers: https://www.young.biz/  This test is not yet approved or cleared by the [place] FDA and  has been authorized for detection and/or diagnosis of SARS-CoV-2 by  FDA under an Emergency Use Authorization (EUA). This EUA will remain  in effect (meaning this test can be used) for the duration of the  Covid-19 declaration under Section 564(b)(1) of the Act, 21  U.S.C. section 360bbb-3(b)(1), unless the authorization is  terminated or revoked. Performed at [facility], [address] [place], [place] [address]   Culture, blood (routine x 2)     Status: None (Preliminary result)   Collection Time: 06/04/20  1:58 AM   Specimen: BLOOD  Result Value Ref Range Status   Specimen Description BLOOD LEFT ANTECUBITAL  Final   Special Requests   Final    BOTTLES DRAWN AEROBIC AND ANAEROBIC Blood Culture adequate volume   Culture   Final    NO GROWTH 2 DAYS Performed at [facility], [address], [place], [place] [address]    Report Status PENDING  Incomplete  Culture, blood (routine x 2)     Status: None (Preliminary result)   Collection Time: 06/04/20  2:08 AM   Specimen: BLOOD LEFT HAND  Result Value Ref Range Status   Specimen Description BLOOD LEFT HAND  Final   Special Requests   Final    BOTTLES DRAWN AEROBIC AND ANAEROBIC Blood Culture adequate volume   Culture   Final    NO GROWTH 2 DAYS Performed at [facility], [address], [place], [place] [address]    Report Status PENDING  Incomplete     Time coordinating discharge: Over 30 minutes  SIGNED:   Cipriano Bunker, MD  [facility] 06/06/2020, 11:53 AM Pager   If 7PM-7AM, please contact night-coverage [email or web address]

## 2020-06-06 NOTE — Discharge Instructions (Signed)
Advised to follow-up with primary care physician in 1 week. Advised to take Augmentin 875 twice a day for 7 more days to complete 10-day course for perirectal abscess. Wife was advised to do packing twice a day. Cellulitis is significantly improved. Advised to follow-up with general surgery on August 14 appointment has been made.

## 2020-06-10 LAB — CULTURE, BLOOD (ROUTINE X 2)
Culture: NO GROWTH
Culture: NO GROWTH
Special Requests: ADEQUATE
Special Requests: ADEQUATE

## 2020-06-13 ENCOUNTER — Ambulatory Visit (INDEPENDENT_AMBULATORY_CARE_PROVIDER_SITE_OTHER): Payer: Medicare Other | Admitting: General Surgery

## 2020-06-13 ENCOUNTER — Encounter: Payer: Self-pay | Admitting: General Surgery

## 2020-06-13 ENCOUNTER — Other Ambulatory Visit: Payer: Self-pay

## 2020-06-13 VITALS — BP 105/70 | HR 92 | Temp 99.2°F | Resp 16 | Ht 67.0 in | Wt 187.0 lb

## 2020-06-13 DIAGNOSIS — K611 Rectal abscess: Secondary | ICD-10-CM

## 2020-06-13 DIAGNOSIS — K603 Anal fistula: Secondary | ICD-10-CM | POA: Insufficient documentation

## 2020-06-13 MED ORDER — AMOXICILLIN-POT CLAVULANATE 875-125 MG PO TABS: 1.0000 | ORAL_TABLET | Freq: Two times a day (BID) | ORAL | 0 refills | Status: AC

## 2020-06-13 NOTE — Progress Notes (Signed)
[facility] History and Physical  Reason for Referral: Perirectal abscess  Referring Physician: Hospital Follow up   Chief Complaint    New Patient (Initial Visit)      Steve Nunez is a 54 y.o. male.  HPI: Mr.  Nunez is a sweet 54 yo who reports about 4-5 perirectal abscess in the last 1-2 years. He has had to get antibiotics for these and had his first I&D in the ED last week. He was sent home on Augment and says that overall the area where the incision was is less swollen and his wife is no longer able to pack it but he is feeling swelling closer to the midline of this anal region.  He says that he has not noticed any drainage. He is scheduled to end the antibiotics today.  He denies any fevers. He says that he has had a colonoscopy about 2 years ago in [place] that was completely normal and follow up in 10 years. He has no history of Crohn's or IBD or mucus/ bloody BMs. He has no known family history of Crohn's.  He had scrotal edema and cellulitis that is improved.  He says that he has no SOB or chest pain and that he had a hospital admission where he was fluid overloaded and he was told he had some heart failure but repeat ECHO did not some any heart failure and he does not have to follow with cardiology.    Past Medical History:  Diagnosis Date  . Bipolar 1 disorder (HCC)   . CHF (congestive heart failure) (HCC)   . DDD (degenerative disc disease), cervical   . Depression   . Diabetes mellitus without complication (HCC)   . GERD (gastroesophageal reflux disease)   . Hypercholesterolemia   . Hyperlipidemia   . Hypertension   . only has 1 kidney    one kidney  . Parkinson's disease (HCC) 2010    Past Surgical History:  Procedure Laterality Date  . BIOPSY  05/13/2020   Procedure: BIOPSY;  Surgeon: [name], DO;  Location: [facility];  Service: Endoscopy;;  . BYPASS AXILLA/BRACHIAL ARTERY Right 1981  . CARPAL TUNNEL RELEASE Right 2010  .  ESOPHAGOGASTRODUODENOSCOPY (EGD) WITH PROPOFOL N/A 05/13/2020   Procedure: ESOPHAGOGASTRODUODENOSCOPY (EGD) WITH PROPOFOL;  Surgeon: [name], DO;  Location: [facility];  Service: Endoscopy;  Laterality: N/A;  12:30pm  . NECK SURGERY  2007, 2010, 2013   C3-C7 ant/post fusion, DDD  . TESTICLE REMOVAL Left 1981    Family History  Problem Relation Age of Onset  . Hypertension Father     Social History   Tobacco Use  . Smoking status: Current Every Day Smoker    Packs/day: 0.50    Years: 30.00    Pack years: 15.00    Types: Cigarettes  . Smokeless tobacco: Never Used  Vaping Use  . Vaping Use: Never used  Substance Use Topics  . Alcohol use: No  . Drug use: No    Medications: I have reviewed the patient's current medications. Allergies as of 06/13/2020      Reactions   Depakote [divalproex Sodium] Other (See Comments)   Panic attacks   Sulfa Antibiotics Hives      Medication List       Accurate as of June 13, 2020  4:12 PM. If you have any questions, ask your nurse or doctor.        acarbose 50 MG tablet Commonly known as:  PRECOSE Take 50 mg by mouth in the morning, at noon, and at bedtime.   amLODipine 10 MG tablet Commonly known as: NORVASC Take 10 mg every evening by mouth.   amoxicillin-clavulanate 875-125 MG tablet Commonly known as: Augmentin Take 1 tablet by mouth 2 (two) times daily for 7 days.   ARIPiprazole 15 MG tablet Commonly known as: ABILIFY Take 15 mg by mouth 2 (two) times daily.   atorvastatin 80 MG tablet Commonly known as: LIPITOR Take 80 mg by mouth daily.   carbidopa-levodopa 25-100 MG tablet Commonly known as: SINEMET IR Take 2.5 tablets by mouth in the morning, at noon, and at bedtime.   fenofibrate 145 MG tablet Commonly known as: TRICOR Take 145 mg by mouth every evening.   glimepiride 1 MG tablet Commonly known as: AMARYL Take 1 mg by mouth daily.   hydrochlorothiazide 25 MG tablet Commonly known as:  HYDRODIURIL Take 25 mg by mouth daily.   levothyroxine 50 MCG tablet Commonly known as: SYNTHROID Take 50 mcg by mouth daily before breakfast.   lisinopril 20 MG tablet Commonly known as: ZESTRIL Take 20 mg by mouth in the morning. Take with 5 mg for a total of 25 mg daily   lisinopril 5 MG tablet Commonly known as: ZESTRIL Take 5 mg by mouth in the morning. Take with 20 mg for a total of 25 mg   metFORMIN 500 MG tablet Commonly known as: GLUCOPHAGE Take 1,000 mg by mouth 2 (two) times daily with a meal.   nystatin ointment Commonly known as: MYCOSTATIN Apply 1 application topically daily as needed for mouth pain.   omega-3 acid ethyl esters 1 g capsule Commonly known as: LOVAZA Take 1 capsule by mouth 4 (four) times daily.   ondansetron 4 MG disintegrating tablet Commonly known as: ZOFRAN-ODT Take 4 mg by mouth 2 (two) times daily as needed for nausea or vomiting.   Oxycodone HCl 10 MG Tabs Take 10 mg by mouth 3 (three) times daily as needed for moderate pain or severe pain.   pantoprazole 40 MG tablet Commonly known as: Protonix Take 1 tablet (40 mg total) by mouth 2 (two) times daily before a meal.   rOPINIRole 2 MG tablet Commonly known as: REQUIP Take 2 mg by mouth 3 (three) times daily.   sucralfate 1 g tablet Commonly known as: Carafate Take 1 tablet (1 g total) by mouth 4 (four) times daily.   trihexyphenidyl 2 MG tablet Commonly known as: ARTANE Take 1 mg by mouth 3 (three) times daily.        ROS:  A comprehensive review of systems was negative except for: Gastrointestinal: positive for perianal pain and tenderness, prior abscess  Blood pressure 105/70, pulse 92, temperature 99.2 F (37.3 C), temperature source Oral, resp. rate 16, height 5\' 7"  (1.702 m), weight 187 lb (84.8 kg), SpO2 97 %. Physical Exam Vitals reviewed.  HENT:     Head: Normocephalic.     Nose: Nose normal.     Mouth/Throat:     Mouth: Mucous membranes are moist.  Eyes:      Extraocular Movements: Extraocular movements intact.  Cardiovascular:     Rate and Rhythm: Normal rate and regular rhythm.  Pulmonary:     Effort: Pulmonary effort is normal.     Breath sounds: Normal breath sounds.  Abdominal:     General: There is no distension.     Palpations: Abdomen is soft.     Tenderness: There is no abdominal tenderness.  Genitourinary:    Comments: Perianal region with some pits from prior drainage site, left anterior incision closing and qtip without tracking, induration anterior midline with purulence expressed, no fluctuant area to I&D Musculoskeletal:        General: No swelling. Normal range of motion.     Cervical back: Normal range of motion.  Skin:    General: Skin is warm.  Neurological:     General: No focal deficit present.     Mental Status: He is alert and oriented to person, place, and time.  Psychiatric:        Mood and Affect: Mood normal.        Behavior: Behavior normal.        Thought Content: Thought content normal.        Judgment: Judgment normal.     Results: None   OSH Records-  Echocardiogram Stress Dobutamine  02/12/2015 Stress Echocardiography Report Demographics Patient Name EXAVIER LINA Gender Male Patient Number [id number] Race Caucasian Visit Number [id number] Room Number 720 Accession Number [id number] HP Date of Study 02/12/2015 Date of Birth 04-13-1966 Referring Physician ED Darlys Gales, MD Age 23 year(s) Sonographer Lavenia Atlas Interpreting Holley Raring, MD Physician Procedure Type of Study Stress procedure: ECHOCARDIOGRAM STRESS DOBUTAMINE. Procedure Date Date: 02/12/2015 Start: 12:00 AM Study Location: Echo Lab Indications: Chest pain. Patient Status: Routine Height: 67 inches Weight: 214 pounds BSA: 2.08 m2 BMI: 33.52 kg/m2 Rhythm: Normal sinus rhythm HR: 74 bpm BP: 145/75 mmHg Conclusions Summary Normal resting biventricular function (ejection fraction), with no resting segmental abnormality. No clinical or  echocardiographic ischemia (induced wall motion abnormality) Negative dobutamine stress echocardiogram  Assessment & Plan:  ETHANJAMES FONTENOT Nunez is a 54 y.o. 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  Discussed that he has no history of Crohn's or IBD and normal colonoscopy in the past. Discussed that fistula causes repeat abscesses and pathophysiology behind this phenomenon. Discussed that we need to do an exam under anesthesia and investigate and possibly perform a fistulotomy if there is no sphincter or minimal sphincter involved or place a seton and refer to colorectal surgery.   -Discussed risk of bleeding, infection, seton placement, incontinence, need for additional surgery  -EUA with possible fistulotomy Wednesday, COVID test preop  -Augmentin refilled given induration and purulence expressed to prevent worsening until EUA  All questions were answered to the satisfaction of the patient.   Lucretia Roers 06/13/2020, 4:12 PM

## 2020-06-13 NOTE — Patient Instructions (Signed)
Anal Fistula  An anal fistula is a hole that develops between the bowel and the skin near the anus. The anus allows stool (feces) to leave the body. The anus has many tiny glands that make lubricating fluid. Sometimes, these glands become plugged and infected. This can cause a fluid-filled pocket (abscess) to form. An anal fistula often occurs when an abscess becomes infected and then develops into a hole between the bowel and the skin. What are the causes? In most cases, an anal fistula is caused by a past or current buildup of pus around the anus (anal abscess). Other causes include:  A complication of surgery.  Injury to the rectum or the area around it.  Using high-energy beams (radiation) to treat the area around the rectum. What increases the risk? You are more likely to develop this condition if you have certain medical conditions or diseases, including:  Chronic inflammatory bowel disease, such as Crohn's disease or ulcerative colitis.  Colon cancer or rectal cancer.  Diverticular disease, such as diverticulitis.  A sexually transmitted infection, or STI, such as gonorrhea, chlamydia, or syphilis.  An infection that is caused by HIV. What are the signs or symptoms? Symptoms of this condition include:  Throbbing or constant pain that may be worse while you are sitting.  Swelling or irritation around the anus.  Pus or blood from an opening near the anus.  Pain when passing stool.  Fever or chills. How is this diagnosed? This condition is diagnosed based on:  A physical exam. This may include: ? An exam to find the external opening of the fistula. ? An exam with a probe or scope to help locate the internal opening of the fistula. ? An exam of the rectum with a gloved hand (digital rectal exam).  Imaging tests that use dye to find the exact location and path of the fistula. Tests may include: ? X-rays. ? Ultrasound. ? CT scan. ? MRI.  Other tests to find the cause  of the anal fistula. How is this treated? This condition is most commonly treated with surgery. The type of surgery that is used will depend on where the fistula is located and how complex the fistula is. Surgery may include:  A fistulotomy. The whole fistula is opened up, and the contents are drained to promote healing.  Seton placement. A silk string (seton) is placed into the fistula during a fistulotomy. This helps to drain any infection and promote healing.  Advancement flap procedure. Tissue is removed from your rectum or the skin around the anus and attached to the opening of the fistula.  Bioprosthetic plug. A cone-shaped plug is made from your tissue and is used to block the opening of the fistula. Some anal fistulas do not require surgery. A nonsurgical treatment option involves injecting a fibrin glue to seal the fistula. You also may be prescribed an antibiotic medicine to treat any infection. Follow these instructions at home: Medicines  Take over-the-counter and prescription medicines only as told by your health care provider.  If you were prescribed an antibiotic medicine, take it as told by your health care provider. Do not stop taking the antibiotic even if you start to feel better.  Use a stool softener or a laxative if told to do so by your health care provider. General instructions   Eat a high-fiber diet as told by your health care provider. This can help to prevent constipation.  Drink enough fluid to keep your urine pale yellow.    Take a warm sitz bath for 15-20 minutes, 3-4 times per day, or as told by your health care provider. Sitz baths can ease your pain and discomfort and help with healing.  Follow good hygiene to keep the anal area as clean and dry as possible. Use wet toilet paper or a moist towelette after each bowel movement.  Keep all follow-up visits as told by your health care provider. This is important. Contact a health care provider if you  have:  Increased pain that is not controlled with medicines.  New redness or swelling around the anal area.  New fluid, blood, or pus coming from the anal area.  Tenderness or warmth around the anal area. Get help right away if you have:  A fever.  Severe pain.  Chills or diarrhea.  Severe problems urinating or having a bowel movement. Summary  An anal fistula is a hole that develops between the bowel and the skin near the anus.  This condition is most often caused by a buildup of pus around the anus (anal abscess). Other causes include a complication of surgery, an injury to the rectum, or the use of radiation to treat the rectal area.  This condition is most commonly treated with surgery.  Follow your health care provider's instructions about taking medicines, eating and drinking, or taking sitz baths.  Call your health care provider if you have more pain, swelling, or blood. Get help right away if you have fever, severe pain, or problems passing urine or stool. This information is not intended to replace advice given to you by your health care provider. Make sure you discuss any questions you have with your health care provider. Document Revised: 12/31/2017 Document Reviewed: 12/31/2017 Elsevier Patient Education  2020 [employer]   Anal Fistulotomy  Anal fistulotomy is a surgical procedure to open and drain an anal fistula. An anal fistula is an abnormal tunnel that develops between the bowel and the skin near the outside of the anus, where stool (feces) comes out. During this procedure, the fistula is opened up and the contents are drained to promote healing. Tell a health care provider about:  Any allergies you have.  All medicines you are taking, including vitamins, herbs, eye drops, creams, and over-the-counter medicines.  Any problems you or family members have had with anesthetic medicines.  Any blood disorders you have.  Any surgeries you have had.  Any  medical conditions you have.  Any problems you have controlling your bowel movements (incontinence).  Whether you are pregnant or may be pregnant. What are the risks? Generally, this is a safe procedure. However, problems may occur, including:  Infection.  Bleeding.  Allergic reactions to medicines or dyes.  Damage to nearby structures or organs.  Incontinence or leaking of stool.  Being unable to empty your bladder (urinary retention).  Needing more surgery, if the fistula returns. Medicines Ask your health care provider about:  Changing or stopping your regular medicines. This is especially important if you are taking diabetes medicines or blood thinners.  Taking medicines such as aspirin and ibuprofen. These medicines can thin your blood. Do not take these medicines unless your health care provider tells you to take them.  Taking over-the-counter medicines, vitamins, herbs, and supplements. Surgery safety Ask your health care provider:  How your surgery site will be marked.  What steps will be taken to help prevent infection. These may include: ? Removing hair at the surgery site. ? Washing skin with a germ-killing soap. ?  Taking antibiotic medicine. General instructions  Do not use any products that contain nicotine or tobacco for at least 4 weeks before the procedure. These products include cigarettes, e-cigarettes, and chewing tobacco. If you need help quitting, ask your health care provider.  You may have an exam or testing.  You may have a blood or urine sample taken.  You may be told to take a medicine that helps you have a bowel movement (laxative) or use an enema to clean your bowels before surgery.  Plan to have someone take you home from the hospital or clinic.  Plan to have a responsible adult care for you for at least 24 hours after you leave the hospital or clinic. This is important. What happens during the procedure?  An IV will be inserted into  one of your veins.  You will be given one or more of the following: ? A medicine to help you relax (sedative). ? A medicine to numb the area (local anesthetic). ? A medicine to make you fall asleep (general anesthetic).  Your surgeon will locate the internal opening of your fistula.  An incision will be made in the fistula opening. The incision may extend into the muscles around the anus (sphincter muscles).  The fistula will be cut open and drained.  Gauze bandages (dressings) may be placed inside of the fistula. The procedure may vary among health care providers and hospitals. What happens after the procedure?   Your blood pressure, heart rate, breathing rate, and blood oxygen level will be monitored until you leave the hospital or clinic.  Do not drive for 24 hours if you were given a sedative during your procedure.  You may continue to receive fluids and medicines through an IV.  You may have some bleeding from your incision. You may have to wear a pad to absorb blood. Summary  Anal fistulotomy is a surgical procedure to open and drain an anal fistula, which is an abnormal tunnel that develops between the bowel and the skin near the outside of the anus.  Before the procedure, tell your health care provider about any medical problems you have or have had, including problems controlling bowel movements.  You will be told what to eat and drink before the procedure, and what medicines to change or stop. Follow these instructions carefully.  This is a safe procedure. However, problems may occur, including incontinence or leaking of stool and being unable to empty your bladder (urinary retention).  You may have some bleeding from your incision after the procedure. You may have to wear a pad to absorb blood. This information is not intended to replace advice given to you by your health care provider. Make sure you discuss any questions you have with your health care  provider. Document Revised: 01/30/2019 Document Reviewed: 01/30/2019 Elsevier Patient Education  2020 [employer].

## 2020-06-14 ENCOUNTER — Other Ambulatory Visit: Payer: Self-pay | Admitting: Internal Medicine

## 2020-06-14 NOTE — H&P (Signed)
[facility] History and Physical  Reason for Referral: Perirectal abscess  Referring Physician: Hospital Follow up   Chief Complaint    New Patient (Initial Visit)      Steve Nunez is a 54 y.o. male.  HPI: Mr.  Steve Nunez is a sweet 54 yo who reports about 4-5 perirectal abscess in the last 1-2 years. He has had to get antibiotics for these and had his first I&D in the ED last week. He was sent home on Augment and says that overall the area where the incision was is less swollen and his wife is no longer able to pack it but he is feeling swelling closer to the midline of this anal region.  He says that he has not noticed any drainage. He is scheduled to end the antibiotics today.  He denies any fevers. He says that he has had a colonoscopy about 2 years ago in [place] that was completely normal and follow up in 10 years. He has no history of Crohn's or IBD or mucus/ bloody BMs. He has no known family history of Crohn's.  He had scrotal edema and cellulitis that is improved.  He says that he has no SOB or chest pain and that he had a hospital admission where he was fluid overloaded and he was told he had some heart failure but repeat ECHO did not some any heart failure and he does not have to follow with cardiology.    Past Medical History:  Diagnosis Date  . Bipolar 1 disorder (HCC)   . CHF (congestive heart failure) (HCC)   . DDD (degenerative disc disease), cervical   . Depression   . Diabetes mellitus without complication (HCC)   . GERD (gastroesophageal reflux disease)   . Hypercholesterolemia   . Hyperlipidemia   . Hypertension   . only has 1 kidney    one kidney  . Parkinson's disease (HCC) 2010    Past Surgical History:  Procedure Laterality Date  . BIOPSY  05/13/2020   Procedure: BIOPSY;  Surgeon: [name], DO;  Location: [facility];  Service: Endoscopy;;  . BYPASS AXILLA/BRACHIAL ARTERY Right 1981  . CARPAL TUNNEL RELEASE Right 2010  .  ESOPHAGOGASTRODUODENOSCOPY (EGD) WITH PROPOFOL N/A 05/13/2020   Procedure: ESOPHAGOGASTRODUODENOSCOPY (EGD) WITH PROPOFOL;  Surgeon: [name], DO;  Location: [facility];  Service: Endoscopy;  Laterality: N/A;  12:30pm  . NECK SURGERY  2007, 2010, 2013   C3-C7 ant/post fusion, DDD  . TESTICLE REMOVAL Left 1981    Family History  Problem Relation Age of Onset  . Hypertension Father     Social History   Tobacco Use  . Smoking status: Current Every Day Smoker    Packs/day: 0.50    Years: 30.00    Pack years: 15.00    Types: Cigarettes  . Smokeless tobacco: Never Used  Vaping Use  . Vaping Use: Never used  Substance Use Topics  . Alcohol use: No  . Drug use: No    Medications: I have reviewed the patient's current medications. Allergies as of 06/13/2020      Reactions   Depakote [divalproex Sodium] Other (See Comments)   Panic attacks   Sulfa Antibiotics Hives      Medication List       Accurate as of June 13, 2020  4:12 PM. If you have any questions, ask your nurse or doctor.        acarbose 50 MG tablet Commonly known as:  PRECOSE Take 50 mg by mouth in the morning, at noon, and at bedtime.   amLODipine 10 MG tablet Commonly known as: NORVASC Take 10 mg every evening by mouth.   amoxicillin-clavulanate 875-125 MG tablet Commonly known as: Augmentin Take 1 tablet by mouth 2 (two) times daily for 7 days.   ARIPiprazole 15 MG tablet Commonly known as: ABILIFY Take 15 mg by mouth 2 (two) times daily.   atorvastatin 80 MG tablet Commonly known as: LIPITOR Take 80 mg by mouth daily.   carbidopa-levodopa 25-100 MG tablet Commonly known as: SINEMET IR Take 2.5 tablets by mouth in the morning, at noon, and at bedtime.   fenofibrate 145 MG tablet Commonly known as: TRICOR Take 145 mg by mouth every evening.   glimepiride 1 MG tablet Commonly known as: AMARYL Take 1 mg by mouth daily.   hydrochlorothiazide 25 MG tablet Commonly known as:  HYDRODIURIL Take 25 mg by mouth daily.   levothyroxine 50 MCG tablet Commonly known as: SYNTHROID Take 50 mcg by mouth daily before breakfast.   lisinopril 20 MG tablet Commonly known as: ZESTRIL Take 20 mg by mouth in the morning. Take with 5 mg for a total of 25 mg daily   lisinopril 5 MG tablet Commonly known as: ZESTRIL Take 5 mg by mouth in the morning. Take with 20 mg for a total of 25 mg   metFORMIN 500 MG tablet Commonly known as: GLUCOPHAGE Take 1,000 mg by mouth 2 (two) times daily with a meal.   nystatin ointment Commonly known as: MYCOSTATIN Apply 1 application topically daily as needed for mouth pain.   omega-3 acid ethyl esters 1 g capsule Commonly known as: LOVAZA Take 1 capsule by mouth 4 (four) times daily.   ondansetron 4 MG disintegrating tablet Commonly known as: ZOFRAN-ODT Take 4 mg by mouth 2 (two) times daily as needed for nausea or vomiting.   Oxycodone HCl 10 MG Tabs Take 10 mg by mouth 3 (three) times daily as needed for moderate pain or severe pain.   pantoprazole 40 MG tablet Commonly known as: Protonix Take 1 tablet (40 mg total) by mouth 2 (two) times daily before a meal.   rOPINIRole 2 MG tablet Commonly known as: REQUIP Take 2 mg by mouth 3 (three) times daily.   sucralfate 1 g tablet Commonly known as: Carafate Take 1 tablet (1 g total) by mouth 4 (four) times daily.   trihexyphenidyl 2 MG tablet Commonly known as: ARTANE Take 1 mg by mouth 3 (three) times daily.        ROS:  A comprehensive review of systems was negative except for: Gastrointestinal: positive for perianal pain and tenderness, prior abscess  Blood pressure 105/70, pulse 92, temperature 99.2 F (37.3 C), temperature source Oral, resp. rate 16, height 5\' 7"  (1.702 m), weight 187 lb (84.8 kg), SpO2 97 %. Physical Exam Vitals reviewed.  HENT:     Head: Normocephalic.     Nose: Nose normal.     Mouth/Throat:     Mouth: Mucous membranes are moist.  Eyes:      Extraocular Movements: Extraocular movements intact.  Cardiovascular:     Rate and Rhythm: Normal rate and regular rhythm.  Pulmonary:     Effort: Pulmonary effort is normal.     Breath sounds: Normal breath sounds.  Abdominal:     General: There is no distension.     Palpations: Abdomen is soft.     Tenderness: There is no abdominal tenderness.  Genitourinary:    Comments: Perianal region with some pits from prior drainage site, left anterior incision closing and qtip without tracking, induration anterior midline with purulence expressed, no fluctuant area to I&D Musculoskeletal:        General: No swelling. Normal range of motion.     Cervical back: Normal range of motion.  Skin:    General: Skin is warm.  Neurological:     General: No focal deficit present.     Mental Status: He is alert and oriented to person, place, and time.  Psychiatric:        Mood and Affect: Mood normal.        Behavior: Behavior normal.        Thought Content: Thought content normal.        Judgment: Judgment normal.     Results: None   OSH Records-  Echocardiogram Stress Dobutamine  02/12/2015 Stress Echocardiography Report Demographics Patient Name Steve Nunez Chazz G Gender Male Patient Number [id number] Race Caucasian Visit Number [id number] Room Number 720 Accession Number [id number] Date of Study 02/12/2015 Date of Birth 05/30/1966 Referring Physician ED REEDY, MD Age 49 year(s) Sonographer Brooke Strickland Interpreting Andy Chiu, MD Physician Procedure Type of Study Stress procedure: ECHOCARDIOGRAM STRESS DOBUTAMINE. Procedure Date Date: 02/12/2015 Start: 12:00 AM Study Location: Echo Lab Indications: Chest pain. Patient Status: Routine Height: 67 inches Weight: 214 pounds BSA: 2.08 m2 BMI: 33.52 kg/m2 Rhythm: Normal sinus rhythm HR: 74 bpm BP: 145/75 mmHg Conclusions Summary Normal resting biventricular function (ejection fraction), with no resting segmental abnormality. No clinical or  echocardiographic ischemia (induced wall motion abnormality) Negative dobutamine stress echocardiogram  Assessment & Plan:  Naksh G Pund Nunez is a 54 y.o. 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  Discussed that he has no history of Crohn's or IBD and normal colonoscopy in the past. Discussed that fistula causes repeat abscesses and pathophysiology behind this phenomenon. Discussed that we need to do an exam under anesthesia and investigate and possibly perform a fistulotomy if there is no sphincter or minimal sphincter involved or place a seton and refer to colorectal surgery.   -Discussed risk of bleeding, infection, seton placement, incontinence, need for additional surgery  -EUA with possible fistulotomy Wednesday, COVID test preop  -Augmentin refilled given induration and purulence expressed to prevent worsening until EUA  All questions were answered to the satisfaction of the patient.   Lilianna Case C Hubbard [place] 06/13/2020, 4:12 PM       

## 2020-06-17 ENCOUNTER — Encounter (HOSPITAL_COMMUNITY): Payer: Self-pay

## 2020-06-17 ENCOUNTER — Encounter (HOSPITAL_COMMUNITY)
Admission: RE | Admit: 2020-06-17 | Discharge: 2020-06-17 | Disposition: A | Discharge status: Home / Self Care | Payer: Medicare Other | Source: Ambulatory Visit | Attending: General Surgery | Admitting: General Surgery

## 2020-06-17 ENCOUNTER — Other Ambulatory Visit: Payer: Self-pay

## 2020-06-18 ENCOUNTER — Other Ambulatory Visit: Payer: Self-pay

## 2020-06-18 ENCOUNTER — Other Ambulatory Visit (HOSPITAL_COMMUNITY)
Admission: RE | Admit: 2020-06-18 | Discharge: 2020-06-18 | Disposition: A | Discharge status: Home / Self Care | Payer: Medicare Other | Source: Ambulatory Visit | Attending: General Surgery | Admitting: General Surgery

## 2020-06-18 DIAGNOSIS — Z01812 Encounter for preprocedural laboratory examination: Secondary | ICD-10-CM | POA: Insufficient documentation

## 2020-06-18 DIAGNOSIS — Z20822 Contact with and (suspected) exposure to covid-19: Secondary | ICD-10-CM | POA: Insufficient documentation

## 2020-06-18 LAB — SARS CORONAVIRUS 2 (TAT 6-24 HRS): SARS Coronavirus 2: NEGATIVE

## 2020-06-19 ENCOUNTER — Ambulatory Visit (HOSPITAL_COMMUNITY): Payer: Medicare Other | Admitting: Certified Registered"

## 2020-06-19 ENCOUNTER — Encounter (HOSPITAL_COMMUNITY)
Admission: RE | Disposition: A | Discharge status: Home / Self Care | Payer: Self-pay | Source: Home / Self Care | Attending: General Surgery

## 2020-06-19 ENCOUNTER — Ambulatory Visit (HOSPITAL_COMMUNITY)
Admission: RE | Admit: 2020-06-19 | Discharge: 2020-06-19 | Disposition: A | Discharge status: Home / Self Care | Payer: Medicare Other | Attending: General Surgery | Admitting: General Surgery

## 2020-06-19 ENCOUNTER — Encounter (HOSPITAL_COMMUNITY): Payer: Self-pay | Admitting: General Surgery

## 2020-06-19 DIAGNOSIS — K219 Gastro-esophageal reflux disease without esophagitis: Secondary | ICD-10-CM | POA: Diagnosis not present

## 2020-06-19 DIAGNOSIS — E785 Hyperlipidemia, unspecified: Secondary | ICD-10-CM | POA: Insufficient documentation

## 2020-06-19 DIAGNOSIS — K612 Anorectal abscess: Secondary | ICD-10-CM | POA: Insufficient documentation

## 2020-06-19 DIAGNOSIS — E119 Type 2 diabetes mellitus without complications: Secondary | ICD-10-CM | POA: Insufficient documentation

## 2020-06-19 DIAGNOSIS — Z9079 Acquired absence of other genital organ(s): Secondary | ICD-10-CM | POA: Insufficient documentation

## 2020-06-19 DIAGNOSIS — Z882 Allergy status to sulfonamides status: Secondary | ICD-10-CM | POA: Diagnosis not present

## 2020-06-19 DIAGNOSIS — F319 Bipolar disorder, unspecified: Secondary | ICD-10-CM | POA: Diagnosis not present

## 2020-06-19 DIAGNOSIS — I110 Hypertensive heart disease with heart failure: Secondary | ICD-10-CM | POA: Insufficient documentation

## 2020-06-19 DIAGNOSIS — I509 Heart failure, unspecified: Secondary | ICD-10-CM | POA: Insufficient documentation

## 2020-06-19 DIAGNOSIS — Z888 Allergy status to other drugs, medicaments and biological substances status: Secondary | ICD-10-CM | POA: Insufficient documentation

## 2020-06-19 DIAGNOSIS — G20 Parkinson's disease: Secondary | ICD-10-CM | POA: Diagnosis not present

## 2020-06-19 DIAGNOSIS — F17210 Nicotine dependence, cigarettes, uncomplicated: Secondary | ICD-10-CM | POA: Diagnosis not present

## 2020-06-19 DIAGNOSIS — M5030 Other cervical disc degeneration, unspecified cervical region: Secondary | ICD-10-CM | POA: Diagnosis not present

## 2020-06-19 DIAGNOSIS — K603 Anal fistula, unspecified: Secondary | ICD-10-CM | POA: Diagnosis present

## 2020-06-19 DIAGNOSIS — Z8249 Family history of ischemic heart disease and other diseases of the circulatory system: Secondary | ICD-10-CM | POA: Insufficient documentation

## 2020-06-19 DIAGNOSIS — E7800 Pure hypercholesterolemia, unspecified: Secondary | ICD-10-CM | POA: Insufficient documentation

## 2020-06-19 DIAGNOSIS — Z981 Arthrodesis status: Secondary | ICD-10-CM | POA: Diagnosis not present

## 2020-06-19 HISTORY — PX: EVALUATION UNDER ANESTHESIA WITH ANAL FISTULECTOMY: SHX5621

## 2020-06-19 LAB — GLUCOSE, CAPILLARY
Glucose-Capillary: 139 mg/dL — ABNORMAL HIGH (ref 70–99)
Glucose-Capillary: 131 mg/dL — ABNORMAL HIGH (ref 70–99)

## 2020-06-19 SURGERY — EXAM UNDER ANESTHESIA WITH ANAL FISTULECTOMY
Anesthesia: General | Site: Anus

## 2020-06-19 MED ORDER — BUPIVACAINE LIPOSOME 1.3 % IJ SUSP
INTRAMUSCULAR | Status: DC | PRN
  Administered 2020-06-19: 20 mL

## 2020-06-19 MED ORDER — PROPOFOL 10 MG/ML IV BOLUS
INTRAVENOUS | Status: AC
  Filled 2020-06-19: qty 20

## 2020-06-19 MED ORDER — FENTANYL CITRATE (PF) 100 MCG/2ML IJ SOLN
INTRAMUSCULAR | Status: DC | PRN
Start: 2020-06-19 — End: 2020-06-19
  Administered 2020-06-19 (×4): 50 ug via INTRAVENOUS

## 2020-06-19 MED ORDER — FENTANYL CITRATE (PF) 100 MCG/2ML IJ SOLN
INTRAMUSCULAR | Status: AC
  Filled 2020-06-19: qty 2

## 2020-06-19 MED ORDER — FENTANYL CITRATE (PF) 100 MCG/2ML IJ SOLN
50.0000 ug | INTRAMUSCULAR | Status: DC | PRN
  Administered 2020-06-19: 50 ug via INTRAVENOUS

## 2020-06-19 MED ORDER — DEXAMETHASONE SODIUM PHOSPHATE 10 MG/ML IJ SOLN
INTRAMUSCULAR | Status: DC | PRN
  Administered 2020-06-19: 10 mg via INTRAVENOUS

## 2020-06-19 MED ORDER — LACTATED RINGERS IV SOLN
INTRAVENOUS | Status: DC
  Administered 2020-06-19: 1000 mL via INTRAVENOUS

## 2020-06-19 MED ORDER — CHLORHEXIDINE GLUCONATE CLOTH 2 % EX PADS: 6.0000 | MEDICATED_PAD | Freq: Once | CUTANEOUS | Status: DC

## 2020-06-19 MED ORDER — CHLORHEXIDINE GLUCONATE 0.12 % MT SOLN
15.0000 mL | Freq: Once | OROMUCOSAL | Status: AC
  Administered 2020-06-19: 15 mL via OROMUCOSAL

## 2020-06-19 MED ORDER — LIDOCAINE VISCOUS HCL 2 % MT SOLN
OROMUCOSAL | Status: AC
  Filled 2020-06-19: qty 15

## 2020-06-19 MED ORDER — DEXAMETHASONE SODIUM PHOSPHATE 10 MG/ML IJ SOLN
INTRAMUSCULAR | Status: AC
  Filled 2020-06-19: qty 1

## 2020-06-19 MED ORDER — ONDANSETRON HCL 4 MG/2ML IJ SOLN
INTRAMUSCULAR | Status: DC | PRN
  Administered 2020-06-19: 4 mg via INTRAVENOUS

## 2020-06-19 MED ORDER — LIDOCAINE 2% (20 MG/ML) 5 ML SYRINGE
INTRAMUSCULAR | Status: AC
  Filled 2020-06-19: qty 5

## 2020-06-19 MED ORDER — SODIUM CHLORIDE 0.9 % IR SOLN
Status: DC | PRN
  Administered 2020-06-19: 1000 mL

## 2020-06-19 MED ORDER — METHYLENE BLUE 0.5 % INJ SOLN
INTRAVENOUS | Status: AC
  Filled 2020-06-19: qty 10

## 2020-06-19 MED ORDER — EPHEDRINE 5 MG/ML INJ
INTRAVENOUS | Status: AC
  Filled 2020-06-19: qty 10

## 2020-06-19 MED ORDER — LIDOCAINE HCL (CARDIAC) PF 100 MG/5ML IV SOSY
PREFILLED_SYRINGE | INTRAVENOUS | Status: DC | PRN
  Administered 2020-06-19: 100 mg via INTRAVENOUS

## 2020-06-19 MED ORDER — BUPIVACAINE LIPOSOME 1.3 % IJ SUSP
INTRAMUSCULAR | Status: AC
  Filled 2020-06-19: qty 20

## 2020-06-19 MED ORDER — HYDROGEN PEROXIDE 3 % EX SOLN
CUTANEOUS | Status: DC | PRN
  Administered 2020-06-19: 1

## 2020-06-19 MED ORDER — SODIUM CHLORIDE 0.9 % IV SOLN
2.0000 g | INTRAVENOUS | Status: AC
  Administered 2020-06-19: 2 g via INTRAVENOUS
  Filled 2020-06-19: qty 2

## 2020-06-19 MED ORDER — METHYLENE BLUE 0.5 % INJ SOLN
INTRAVENOUS | Status: DC | PRN
  Administered 2020-06-19: 2 mL

## 2020-06-19 MED ORDER — MIDAZOLAM HCL 5 MG/5ML IJ SOLN
INTRAMUSCULAR | Status: DC | PRN
  Administered 2020-06-19: 2 mg via INTRAVENOUS

## 2020-06-19 MED ORDER — ONDANSETRON HCL 4 MG/2ML IJ SOLN
INTRAMUSCULAR | Status: AC
  Filled 2020-06-19: qty 2

## 2020-06-19 MED ORDER — EPHEDRINE SULFATE 50 MG/ML IJ SOLN
INTRAMUSCULAR | Status: DC | PRN
  Administered 2020-06-19: 10 mg via INTRAVENOUS

## 2020-06-19 MED ORDER — PROPOFOL 10 MG/ML IV BOLUS
INTRAVENOUS | Status: DC | PRN
  Administered 2020-06-19: 200 mg via INTRAVENOUS

## 2020-06-19 MED ORDER — MIDAZOLAM HCL 2 MG/2ML IJ SOLN
INTRAMUSCULAR | Status: AC
  Filled 2020-06-19: qty 2

## 2020-06-19 MED ORDER — ORAL CARE MOUTH RINSE: 15.0000 mL | Freq: Once | OROMUCOSAL | Status: AC

## 2020-06-19 SURGICAL SUPPLY — 32 items
BAG HAMPER (MISCELLANEOUS) ×4 IMPLANT
CANNULA VESSEL 3MM 2 BLNT TIP (CANNULA) IMPLANT
CLOTH BEACON ORANGE TIMEOUT ST (SAFETY) ×4 IMPLANT
COVER LIGHT HANDLE STERIS (MISCELLANEOUS) ×8 IMPLANT
COVER WAND RF STERILE (DRAPES) ×4 IMPLANT
ELECT REM PT RETURN 9FT ADLT (ELECTROSURGICAL) ×4
ELECTRODE REM PT RTRN 9FT ADLT (ELECTROSURGICAL) ×2 IMPLANT
GAUZE PACKING 2X5 YD STRL (GAUZE/BANDAGES/DRESSINGS) ×4 IMPLANT
GAUZE SPONGE 4X4 12PLY STRL (GAUZE/BANDAGES/DRESSINGS) ×4 IMPLANT
GLOVE BIOGEL M 7.0 STRL (GLOVE) ×4 IMPLANT
GLOVE BIOGEL PI IND STRL 6.5 (GLOVE) ×2 IMPLANT
GLOVE BIOGEL PI IND STRL 7.0 (GLOVE) ×4 IMPLANT
GLOVE BIOGEL PI INDICATOR 6.5 (GLOVE) ×2
GLOVE BIOGEL PI INDICATOR 7.0 (GLOVE) ×4
GOWN STRL REUS W/TWL LRG LVL3 (GOWN DISPOSABLE) ×12 IMPLANT
HEMOSTAT SURGICEL 4X8 (HEMOSTASIS) IMPLANT
KIT TURNOVER CYSTO (KITS) ×4 IMPLANT
MANIFOLD NEPTUNE II (INSTRUMENTS) ×4 IMPLANT
NEEDLE HYPO 21X1.5 SAFETY (NEEDLE) ×4 IMPLANT
NS IRRIG 1000ML POUR BTL (IV SOLUTION) ×4 IMPLANT
PACK PERI GYN (CUSTOM PROCEDURE TRAY) ×4 IMPLANT
PAD ABD 8X10 STRL (GAUZE/BANDAGES/DRESSINGS) ×4 IMPLANT
PAD ARMBOARD 7.5X6 YLW CONV (MISCELLANEOUS) ×4 IMPLANT
SET BASIN LINEN APH (SET/KITS/TRAYS/PACK) ×4 IMPLANT
SURGILUBE 2OZ TUBE FLIPTOP (MISCELLANEOUS) ×4 IMPLANT
SUT SILK 0 FSL (SUTURE) ×4 IMPLANT
SUT SILK 3 0 (SUTURE) ×4
SUT SILK 3-0 18XBRD TIE 12 (SUTURE) ×2 IMPLANT
SUT VIC AB 2-0 CT2 27 (SUTURE) IMPLANT
SYR 20ML LL LF (SYRINGE) ×12 IMPLANT
SYR 30ML LL (SYRINGE) ×4 IMPLANT
VESSEL LOOPS MAXI RED (MISCELLANEOUS) ×4 IMPLANT

## 2020-06-19 NOTE — Discharge Instructions (Signed)
Drain Placement/ Anal Fistula Discharge Instructions:   REMOVE PACKING FROM LEFT SIDE OPENING 06/20/2020. IF IT FALLS OUT BEFORE THAT IS OK. NO NEED TO REPACK BUT KEEP THE PACKING STRIPS AND BRING WITH YOU TO CLINIC IN CASE WE NEED TO START PACKING.  THE SMALL DRAIN (RED LOOP) SHOULD BE ABLE TO COME OUT 06/25/2020.   What is the purpose of a drain placement? A fistula is an abnormal tunnel that connects two structures. An anal fistula is an abnormal tunnel that forms between the anus and the surrounding skin. Your fistula's were superficial but you had an active abscess and a drain was placed to help drain this area.  This drain will be removed in the clinic.   Activity:  Many individuals are able to return to work and resume routine activities the day after their procedure. Some people require a week off from work if the surgery is more extensive. Generally, within 1-2 weeks, surgical discomfort is Minimal.  Care Instructions: You can resume your normal diet once you have sufficiently recovered from anesthesia. You should drink lots of liquid. Water is best. Try to drink at least 6-8 glasses of liquid daily.  Medications: It is very important to prevent constipation after surgery. You should take a stool softener, such as docusate sodium (Colace), twice a day for the first two weeks. Also take a fiber supplement such as Benefiber, Citrucel, or Metamucil, twice a day every day. Consult your pharmacist if you need help. If your stools become loose, you can stop taking the softeners.  Take tylenol and ibuprofen as needed for pain control, alternating every 4-6 hours.  Example:  Tylenol  @ 6am, 12noon, 6pm, (Do not exceed  of tylenol a day).  Ibuprofen  @ 9am, 3pm, 9pm, 3am (Do not exceed  of ibuprofen a day).  Take Roxicodone for breakthrough pain every 4 hours.  Drink plenty of water to also prevent constipation.  Do not drive, operate machinery, or  drink alcohol when taking narcotic pain medication.  Within a few days, your pain should be sufficiently controlled with medications such as ibuprofen or acetaminophen.  Dressing: You can replace your pad as needed after surgery. Feminine pads work best.  It is normal to see some bloody drainage on the dressing.  Bleeding should not be heavy or continuous.   Hygiene: Keep the surgical area clean by taking Sitz baths (or tub baths) several times per day. These are helpful for cleaning after each bowel movement. Frequent showering is an alternative to baths, although many patients find baths soothing after surgery.  Equipment for [employer] can be purchased at [employer] or medical/surgical supply stores. o Use warm (not hot) water for cleansing. o Pat the area dry or use a hairdryer to evaporate any residual moisture. o If you use a hairdryer, use the cool or warm setting, to avoid potential heat injury to the skin.  Sitting on a pillow or ice pack may also provide relief. Do not apply ice directly to the skin, use a towel or pillow case as a buffer. We do not encourage sitting on an inflatable ring (doughnut) as this leads to unopposed downward pressure in the anal area.  You should gently rotate the [place], every other day or so, to prevent it from getting crusted with bodily fluids.  Occasionally, the [place] Pop may become displaced and fall out. If this occurs, it is not an emergency. Please call our office the next business day so that we may  evaluate.  What symptoms should I expect?  It is normal to have pain for up to 1-2 weeks. Thereafter, you may notice discomfort with prolonged sitting and certain activities. Pain should not be constant or worsening.   Placement of drain may stimulate mucus production so the volume of drainage you are having may increase at first. The volume of drainage should lessen as healing occurs.    How to Take a [employer] A sitz bath is  a warm water bath that may be used to care for your rectum, genital area, or the area between your rectum and genitals (perineum). For a sitz bath, the water only comes up to your hips and covers your buttocks. A sitz bath may done at home in a bathtub or with a portable sitz bath that fits over the toilet. Your health care provider may recommend a sitz bath to help:  Relieve pain and discomfort after delivering a baby.  Relieve pain and itching from hemorrhoids or anal fissures.  Relieve pain after certain surgeries.  Relax muscles that are sore or tight. How to take a sitz bath Take 3-4 sitz baths a day, or as many as told by your health care provider. Bathtub sitz bath To take a sitz bath in a bathtub: 1. Partially fill a bathtub with warm water. The water should be deep enough to cover your hips and buttocks when you are sitting in the tub. 2. If your health care provider told you to put medicine in the water, follow his or her instructions. 3. Sit in the water. 4. Open the tub drain a little, and leave it open during your bath. 5. Turn on the warm water again, enough to replace the water that is draining out. Keep the water running throughout your bath. This helps keep the water at the right level and the right temperature. 6. Soak in the water for 15-20 minutes, or as long as told by your health care provider. 7. When you are done, be careful when you stand up. You may feel dizzy. 8. After the sitz bath, pat yourself dry. Do not rub your skin to dry it.  Over-the-toilet sitz bath To take a sitz bath with an over-the-toilet basin: 1. Follow the manufacturer's instructions. 2. Fill the basin with warm water. 3. If your health care provider told you to put medicine in the water, follow his or her instructions. 4. Sit on the seat. Make sure the water covers your buttocks and perineum. 5. Soak in the water for 15-20 minutes, or as long as told by your health care provider. 6. After the  sitz bath, pat yourself dry. Do not rub your skin to dry it. 7. Clean and dry the basin between uses. 8. Discard the basin if it cracks, or according to the manufacturer's instructions. Contact a health care provider if:  Your symptoms get worse. Do not continue with sitz baths if your symptoms get worse.  You have new symptoms. If this happens, do not continue with sitz baths until you talk with your health care provider. Summary  A sitz bath is a warm water bath in which the water only comes up to your hips and covers your buttocks.  A sitz bath may help relieve itching, relieve pain, and relax muscles that are sore or tight in the lower part of your body, including your genital area.  Take 3-4 sitz baths a day, or as many as told by your health care provider. Soak in the  water for 15-20 minutes.  Do not continue with sitz baths if your symptoms get worse. This information is not intended to replace advice given to you by your health care provider. Make sure you discuss any questions you have with your health care provider. Document Revised: 01/16/2019 Document Reviewed: 08/19/2017 Elsevier Patient Education  2020 Elsevier Inc.  Discharge Instructions:  Common Complaints: Diet/ Activity: Diet as tolerated. You may not have an appetite, but it is important to stay hydrated. Drink 64 ounces of water a day. Your appetite will return with time.  Shower per your regular routine daily.  Do not take hot showers. Take warm showers that are less than 10 minutes. Rest and listen to your body, but do not remain in bed all day.  Walk everyday for at least 15-20 minutes. Deep cough and move around every 1-2 hours in the first few days after surgery.  Do not lift > 10 lbs, perform excessive bending, pushing, pulling, squatting for 1-2 weeks after surgery.   Pain Expectations and Narcotics: -After surgery you will have pain associated with your incisions and this is normal. The pain is muscular  and nerve pain, and will get better with time. -You are encouraged and expected to take non narcotic medications like tylenol and ibuprofen (when able) to treat pain as multiple modalities can aid with pain treatment. -Narcotics are only used when pain is severe or there is breakthrough pain. -You are not expected to have a pain score of 0 after surgery, as we cannot prevent pain. A pain score of 3-4 that allows you to be functional, move, walk, and tolerate some activity is the goal. The pain will continue to improve over the days after surgery and is dependent on your surgery. -Due to  law, we are only able to give a certain amount of pain medication to treat post operative pain, and we only give additional narcotics on a patient by patient basis.  -For most laparoscopic surgery, studies have shown that the majority of patients only need 10-15 narcotic pills, and for open surgeries most patients only need 15-20.   -For patient's on chronic pain medication, I am unable to give more if the quantity surpasses what someone would need for this surgery.  -Having appropriate expectations of pain and knowledge of pain management with non narcotics is important as we do not want anyone to become addicted to narcotic pain medication.  -Using ice packs in the first 48 hours and heating pads after 48 hours, wearing an abdominal binder (when recommended), and using over the counter medications are all ways to help with pain management.   -Simple acts like meditation and mindfulness practices after surgery can also help with pain control and research has proven the benefit of these practices.  Medication: Take tylenol and ibuprofen as needed for pain control, alternating every 4-6 hours.  Example:  Tylenol 1000mg  @ 6am, 12noon, 6pm, (Do not exceed 4000mg  of tylenol a day). Ibuprofen 800mg  @ 9am, 3pm, 9pm, 3am (Do not exceed 3600mg  of ibuprofen a day).  Take Roxicodone you have at home for  breakthrough pain. Take Colace for constipation related to narcotic pain medication. If you do not have a bowel movement in 2 days, take Miralax over the counter.  Drink plenty of water to also prevent constipation.   Contact Information: If you have questions or concerns, please call our office, [phone], Monday- Thursday 8AM-5PM and Friday 8AM-12Noon.  If it is after hours or on the weekend,  please call [facility], [phone], and ask to speak to the surgeon on call for Dr. Henreitta Leber at [facility].    Anal Fistulotomy, Care After This sheet gives you information about how to care for yourself after your procedure. Your health care provider may also give you more specific instructions. If you have problems or questions, contact your health care provider. What can I expect after the procedure? After the procedure, it is common to have:  Some pain, discomfort, and swelling.  Increased pain during bowel movements.  Some bleeding from the incision area.  Some leakage of stool. Follow these instructions at home: Medicines  Take over-the-counter and prescription medicines only as told by your health care provider.  If you were prescribed an antibiotic medicine, take it as told by your health care provider. Do not stop taking the antibiotic even if you start to feel better.  Ask your health care provider if the medicine prescribed to you requires you to avoid driving or using heavy machinery. Incision care   Follow instructions from your health care provider about how to take care of your incision. Make sure you: ? Wash your hands with soap and water before and after you remove your gauze (dressing). If soap and water are not available, use hand sanitizer. ? Remove your dressing as told by your health care provider.  In some cases, you may be told not to remove the dressing, but to allow it to come out with your first bowel movement after surgery. ? Leave stitches  (sutures), skin glue, or adhesive strips in place. These skin closures may need to stay in place for 2 weeks or longer. Do not remove adhesive strips completely unless your health care provider tells you to do that.  Keep the incision area clean and dry.  Check your incision area every day for signs of infection. Check for: ? More redness, swelling, or pain. ? More fluid or blood. ? Warmth. ? Pus or a bad smell. Self-care  After a bowel movement, clean the incision area using one of the following methods: ? Gently wipe with a moist towelette. ? Gently wipe with mild soap and water. ? Take a shower. ? Take a sitz bath. This is a shallow, warm-water bath that attaches to the toilet bowl. You can also sit in a bathtub filled with warm water.  Do not swim or use a hot tub until your health care provider approves.  To reduce discomfort, you may apply ice to the incision area. To do this: ? Put ice in a plastic bag. ? Place a towel between your skin and the bag. ? Leave the ice on for 20 minutes, 2-3 times a day. Activity   Rest as told by your health care provider.  Avoid sitting for a long time without moving. Get up to take short walks every 1-2 hours. This is important to improve blood flow and breathing. Ask for help if you feel weak or unsteady.  Do not drive for 24 hours if you were given a sedative during your procedure.  Do not lift anything that is heavier than 10 lb (4.5 kg), or the limit that you are told, until your health care provider says that it is safe.  Return to your normal activities as told by your health care provider. Ask your health care provider what activities are safe for you. Eating and drinking  Follow instructions from your health care provider about eating or drinking restrictions.  You may  need to take these actions to prevent or treat constipation: ? Drink enough fluid to keep your urine pale yellow. ? Eat foods that are high in fiber, such as  beans, whole grains, and fresh fruits and vegetables. ? Limit foods that are high in fat and processed sugars, such as fried or sweet foods. Lifestyle  Do not use any products that contain nicotine or tobacco, such as cigarettes, e-cigarettes, and chewing tobacco. If you need help quitting, ask your health care provider.  Do not drink alcohol if: ? Your health care provider tells you not to drink. ? You are pregnant, may be pregnant, or are planning to become pregnant.  If you drink alcohol: ? Limit how much you use to:  0-1 drink a day for women.  0-2 drinks a day for men. ? Be aware of how much alcohol is in your drink. In the [place] one drink equals one 12 oz bottle of beer (355 mL), one 5 oz glass of wine (148 mL), or one 1 oz glass of hard liquor (44 mL). General instructions  If you have bleeding from the incision area, wear a pad to absorb blood. Change it often.  Keep all follow-up visits as told by your health care provider. This is important. Contact a health care provider if:  You have any of these signs of infection: ? More redness, swelling, or pain around your incision area. ? Warmth coming from your incision area, or your incision area is firm. ? Pus or a bad smell coming from your incision area. ? A fever or chills.  You develop swelling or tenderness in your groin area.  You cannot control your bowel movements (incontinence), or you are leaking stool.  You have trouble urinating.  You have pain that does not get better with medicine. Get help right away if:  You have severe pain in your abdomen or incision area.  You have sudden chest pain.  You become weak or you faint.  You have more fluid or blood coming from your incision.  You have bleeding from your incision that soaks 2 or more pads during 24 hours. Summary  After anal fistulotomy, it is common to have increased pain during bowel movements and some bleeding.  If a dressing was placed in your  incision during surgery, remove it as told by your health care provider.  It is important to keep the incision area clean and dry after each bowel movement.  If you have bleeding from the incision area, wear a pad to absorb blood. Change it often. This information is not intended to replace advice given to you by your health care provider. Make sure you discuss any questions you have with your health care provider. Document Revised: 01/30/2019 Document Reviewed: 01/30/2019 Elsevier Patient Education  2020 [employer]

## 2020-06-19 NOTE — Anesthesia Procedure Notes (Signed)
Procedure Name: LMA Insertion Performed by: Janalyn Higby L, CRNA Pre-anesthesia Checklist: Patient identified, Emergency Drugs available, Suction available, Patient being monitored and Timeout performed Patient Re-evaluated:Patient Re-evaluated prior to induction Oxygen Delivery Method: Circle system utilized Preoxygenation: Pre-oxygenation with 100% oxygen Induction Type: IV induction LMA: LMA inserted LMA Size: 5.0 Number of attempts: 1 Placement Confirmation: positive ETCO2,  CO2 detector and breath sounds checked- equal and bilateral Tube secured with: Tape Dental Injury: Teeth and Oropharynx as per pre-operative assessment        

## 2020-06-19 NOTE — Progress Notes (Signed)
[facility]  Fistulotomy and drain placement performed. Patient on chronic pain medication and will need to use this for pain control in addition to tylenol and ibuprofen. Sitz baths for comfort. Notified father procedure done and plan to see patient next Tuesday.  REMOVE PACKING FROM LEFT SIDE OPENING 06/20/2020. IF IT FALLS OUT BEFORE THAT IS OK. NO NEED TO REPACK BUT KEEP THE PACKING STRIPS AND BRING WITH YOU TO CLINIC IN CASE WE NEED TO START PACKING.  THE SMALL DRAIN (RED LOOP) SHOULD BE ABLE TO COME OUT 06/25/2020.   Algis Greenhouse, MD [facility] [address] [place], [place] [address] [phone] (office)

## 2020-06-19 NOTE — Anesthesia Postprocedure Evaluation (Signed)
Anesthesia Post Note  Patient: Jeananne Rama III  Procedure(s) Performed: INCISION AND DRAINAGE OF PERIANAL ABSCESS  WITH DRAIN PLACEMENT AND FISTULECTOMY (N/A Anus)  Patient location during evaluation: PACU Anesthesia Type: General Level of consciousness: awake, oriented, awake and alert and patient cooperative Pain management: pain level controlled Vital Signs Assessment: post-procedure vital signs reviewed and stable Respiratory status: spontaneous breathing, respiratory function stable and nonlabored ventilation Cardiovascular status: blood pressure returned to baseline and stable Postop Assessment: no headache and no backache Anesthetic complications: no   No complications documented.   Last Vitals:  Vitals:   06/19/20 0731  BP: 129/76  Pulse: 78  Resp: 17  Temp: 37 C  SpO2: 96%    Last Pain:  Vitals:   06/19/20 0731  TempSrc: Oral  PainSc: 0-No pain                 Brynda Peon

## 2020-06-19 NOTE — Anesthesia Preprocedure Evaluation (Signed)
Anesthesia Evaluation  Patient identified by MRN, date of birth, ID band Patient awake    Reviewed: Allergy & Precautions, H&P , NPO status , Patient's Chart, lab work & pertinent test results, reviewed documented beta blocker date and time   Airway Mallampati: II  TM Distance: >3 FB Neck ROM: full    Dental no notable dental hx.    Pulmonary neg pulmonary ROS, Current Smoker and Patient abstained from smoking.,    Pulmonary exam normal breath sounds clear to auscultation       Cardiovascular Exercise Tolerance: Good hypertension, negative cardio ROS   Rhythm:regular Rate:Normal     Neuro/Psych PSYCHIATRIC DISORDERS Depression Bipolar Disorder  Neuromuscular disease    GI/Hepatic Neg liver ROS, GERD  Medicated,  Endo/Other  negative endocrine ROSdiabetes  Renal/GU   negative genitourinary   Musculoskeletal   Abdominal   Peds  Hematology negative hematology ROS (+)   Anesthesia Other Findings   Reproductive/Obstetrics negative OB ROS                             Anesthesia Physical Anesthesia Plan  ASA: III  Anesthesia Plan: General   Post-op Pain Management:    Induction:   PONV Risk Score and Plan: Ondansetron  Airway Management Planned:   Additional Equipment:   Intra-op Plan:   Post-operative Plan:   Informed Consent: I have reviewed the patients History and Physical, chart, labs and discussed the procedure including the risks, benefits and alternatives for the proposed anesthesia with the patient or authorized representative who has indicated his/her understanding and acceptance.     Dental Advisory Given  Plan Discussed with: CRNA  Anesthesia Plan Comments:         Anesthesia Quick Evaluation

## 2020-06-19 NOTE — Interval H&P Note (Signed)
History and Physical Interval Note:  06/19/2020 8:21 AM  Steve Nunez  has presented today for surgery, with the diagnosis of Perianal fistula.  The various methods of treatment have been discussed with the patient and family. After consideration of risks, benefits and other options for treatment, the patient has consented to  Procedure(s): ANAL FISTULOTOMY WITH POSSIBLE SETON PLACEMENT (N/A) as a surgical intervention.  The patient's history has been reviewed, patient examined, no change in status, stable for surgery.  I have reviewed the patient's chart and labs.  Questions were answered to the patient's satisfaction.    Had some improvement after expressed drainage, again feels pressure. Discussed seton versus fistulotomy.   Lucretia Roers

## 2020-06-19 NOTE — Transfer of Care (Signed)
Immediate Anesthesia Transfer of Care Note  Patient: Steve Nunez  Procedure(s) Performed: INCISION AND DRAINAGE OF PERIANAL ABSCESS  WITH DRAIN PLACEMENT AND FISTULECTOMY (N/A Anus)  Patient Location: PACU  Anesthesia Type:General  Level of Consciousness: awake, alert , oriented and patient cooperative  Airway & Oxygen Therapy: Patient Spontanous Breathing  Post-op Assessment: Report given to RN, Post -op Vital signs reviewed and stable and Patient moving all extremities  Post vital signs: Reviewed and stable  Last Vitals:  Vitals Value Taken Time  BP    Temp    Pulse 88 06/19/20 0948  Resp 13 06/19/20 0948  SpO2 92 % 06/19/20 0948  Vitals shown include unvalidated device data.  Last Pain:  Vitals:   06/19/20 0731  TempSrc: Oral  PainSc: 0-No pain      Patients Stated Pain Goal: 4 (06/19/20 0731)  Complications: No complications documented.

## 2020-06-19 NOTE — Op Note (Signed)
[facility] Operative Note  06/19/20  Preoperative Diagnosis: Perianal abscess and fistula   Postoperative Diagnosis: Same   Procedure(s) Performed: Incision and drainage of abscess with drain placement, fistulotomy of superficial skin fistula    Surgeon: Leatrice Jewels. Henreitta Leber, MD   Assistants: No qualified resident was available    Anesthesia: General endotracheal   Anesthesiologist: Windell Norfolk, MD    Specimens: None   Estimated Blood Loss: Minimal   Blood Replacement: None    Complications: None   Wound Class: Dirty/ Infected    Operative Indications: [name] is a very sweet 54 yo who has been having recurrent abscess in the perianal region with multiple rounds of antibiotics and one incision and drainage. He was seen and noted to have continued purulent drainage in the office and findings consistent with fistula. We discussed an exam under anesthesia with possible fistulotomy versus seton placement and risk of bleeding, infection, injury to sphincter complex, need for seton and additional surgeries.   Findings: Superficial anterior abscess with fistula track to the left lateral perianal region need prior incision and drainage site and anterior track at 12 o'clock to perianal skin, no sphincter involved    Procedure: The patient was taken to the operating room and placed supine. General endotracheal anesthesia was induced with LMA. Intravenous antibiotics were given due to the current abscess. He was then placed in lithotomy with all pressure points padded.   Digital exam revealed no masses.  There was a prior incision and drainage cut on the left lateral perianal skin, and induration in the anterior skin between the anus and the scrotum. On palpation purulence was expressed at the 12 o'clock position in the perianal skin.  I placed an anoscope and visualized the wall and injected peroxide in to the prior incision and drainage site, getting peroxide just  medial to the incision and drainage site on the edge of the perianal skin at about 2 o'clock, this was a superficial fistula track. Then using lacrimal probe I investigated further and a track went anterior to the area of induration. Here between the anus and the scrotum I made a counter incision as this was the location of the abscess that was expressing purulence at 12 oc'lock. The lacrimal probe was then used to demonstrate the track down to the 12 o'clock position.  All of this was superficial to the sphincter complex. I placed methylene blue dye into the tracks to help aid in complete removal. Again no peroxide or blue dye was noted internally in the anus and the tracks were all superficial to the sphincter.   Given this complex area, I opted to do a fistulotomy from the incision and drainage site at the left lateral to the 2 o'clock position. I curetted the track of the fistula and the cavity tracking anteriorly and also desiccated it with cautery. The counter incision in the perineal region was then probed and I partially opened up the track down to the 12 o'clock position superior and inferiorly but not unroof the whole thing given the length.  Given that this was the abscess cavity, I do not want this close prematurely and a drain was placed between the counter incision and the 12 o'clock perianal fistula opening using a red vessel loop that was secured with 2-0 silk sutures to itself to form a loop. The entire cavity was curetted and cauterized to remove the epithelized tracks  The left lateral fistulotomy site was packed with plain packing strips and  ABD and mesh panties were placed. Final inspection revealed acceptable hemostasis. All counts were correct at the end of the case. The patient was awakened from anesthesia and extubate without complication.  The patient went to the PACU in stable condition.   Algis Greenhouse, MD [facility] [address] [place],  [place] [address] [phone] (office)

## 2020-06-20 ENCOUNTER — Encounter (HOSPITAL_COMMUNITY): Payer: Self-pay | Admitting: General Surgery

## 2020-06-25 ENCOUNTER — Encounter: Payer: Self-pay | Admitting: General Surgery

## 2020-06-25 ENCOUNTER — Ambulatory Visit (INDEPENDENT_AMBULATORY_CARE_PROVIDER_SITE_OTHER): Payer: Medicare Other | Admitting: General Surgery

## 2020-06-25 ENCOUNTER — Other Ambulatory Visit: Payer: Self-pay

## 2020-06-25 VITALS — BP 126/84 | HR 79 | Temp 98.2°F | Resp 18 | Ht 67.0 in | Wt 192.0 lb

## 2020-06-25 DIAGNOSIS — K611 Rectal abscess: Secondary | ICD-10-CM

## 2020-06-25 DIAGNOSIS — K603 Anal fistula: Secondary | ICD-10-CM

## 2020-06-25 NOTE — Patient Instructions (Signed)
Dab area with peroxide daily. Keep area dry and clean. Do sitz baths for comfort and to clean after BM. Keep stools regular.

## 2020-06-25 NOTE — Progress Notes (Signed)
[facility] Note   HPI:  54 y.o. Adult presents to clinic for post-op follow-up evaluation after fistulotomy and abscess drainage. He is doing well with minimal drainage and no bleeding. Pain is improving.   Review of Systems:  Regular BMs Clear drainage All other review of systems: otherwise negative   Vital Signs:  BP 126/84   Pulse 79   Temp 98.2 F (36.8 C) (Oral)   Resp 18   Ht 5\' 7"  (1.702 m)   Wt 192 lb (87.1 kg)   SpO2 97%   BMI 30.07 kg/m    Physical Exam:  Physical Exam Cardiovascular:     Rate and Rhythm: Normal rate.  Pulmonary:     Effort: Pulmonary effort is normal.  Genitourinary:    Comments: Left lateral fistulotomy site healing, probed and track does not connect superior any longer, I&D site with drain open and draining, healing, removed drain    Assessment:  54 y.o. yo Adult s/p fistulotomy and I&D with drain placement into abscess cavity. Doing well overall. No one at home to pack wound.   Plan:  Dab area with peroxide daily.  Keep area dry and clean.  Do sitz baths for comfort and to clean after BM. Keep stools regular.  Will see back next week and wife to come with him pending need to learn to pack.   Future Appointments  Date Time Provider Department Center  07/02/2020 11:15 AM 13/10/2019, MD RS-RS None  07/04/2020  3:30 PM 13/11/2019, NP [facility]     [facility], MD [facility] [address] [place], [place] [phone] (office)

## 2020-07-02 ENCOUNTER — Ambulatory Visit (INDEPENDENT_AMBULATORY_CARE_PROVIDER_SITE_OTHER): Payer: Medicare Other | Admitting: General Surgery

## 2020-07-02 ENCOUNTER — Other Ambulatory Visit: Payer: Self-pay

## 2020-07-02 ENCOUNTER — Encounter: Payer: Self-pay | Admitting: General Surgery

## 2020-07-02 VITALS — BP 129/80 | HR 93 | Temp 98.8°F | Resp 16 | Ht 67.0 in | Wt 185.0 lb

## 2020-07-02 DIAGNOSIS — K603 Anal fistula: Secondary | ICD-10-CM

## 2020-07-02 DIAGNOSIS — K611 Rectal abscess: Secondary | ICD-10-CM

## 2020-07-02 NOTE — Progress Notes (Signed)
[facility] Note   HPI:  54 y.o. Adult presents to clinic for follow-up evaluation of his fistulotomy site and I&D site. He is doing well.he has been doing the peroxide to the area and keeping it clean and dry otherwise.  Review of Systems:  No fevers or chills Clear drainage All other review of systems: otherwise negative   Vital Signs:  BP 129/80   Pulse 93   Temp 98.8 F (37.1 C) (Oral)   Resp 16   Ht 5\' 7"  (1.702 m)   Wt 185 lb (83.9 kg)   SpO2 95%   BMI 28.98 kg/m    Physical Exam:  Physical Exam Vitals reviewed.  Cardiovascular:     Rate and Rhythm: Normal rate.  Pulmonary:     Effort: Pulmonary effort is normal.  Genitourinary:    Comments: Left fistulotomy site healing, with granulation, I&D track shrinking and tunnel does not connect any longer, peroxide applied Neurological:     Mental Status: He is alert.      Assessment:  54 y.o. yo Adult s/p fistulotomy and I&D of perianal abscess. Doing well.  Plan:  Continue peroxide to the area. Keep clean and dry.   Future Appointments  Date Time Provider Department Center  07/04/2020  3:30 PM 13/11/2019, NP RGA-R[facility]  07/18/2020  1:00 PM 07/20/2020, MD RS-RS None    All of the above recommendations were discussed with the patient and patient's family, and all of patient's and family's questions were answered to their expressed satisfaction.  Lucretia Roers, MD [facility] [address] [place], [place] [phone] (office)

## 2020-07-02 NOTE — Patient Instructions (Addendum)
Continue peroxide to the area. Keep clean and dry.

## 2020-07-04 ENCOUNTER — Other Ambulatory Visit: Payer: Self-pay

## 2020-07-04 ENCOUNTER — Ambulatory Visit (INDEPENDENT_AMBULATORY_CARE_PROVIDER_SITE_OTHER): Payer: Medicare Other | Admitting: Nurse Practitioner

## 2020-07-04 ENCOUNTER — Encounter: Payer: Self-pay | Admitting: Nurse Practitioner

## 2020-07-04 VITALS — BP 116/73 | HR 102 | Temp 97.1°F | Ht 67.0 in | Wt 187.8 lb

## 2020-07-04 DIAGNOSIS — K219 Gastro-esophageal reflux disease without esophagitis: Secondary | ICD-10-CM | POA: Diagnosis not present

## 2020-07-04 DIAGNOSIS — R112 Nausea with vomiting, unspecified: Secondary | ICD-10-CM

## 2020-07-04 DIAGNOSIS — R1084 Generalized abdominal pain: Secondary | ICD-10-CM | POA: Diagnosis not present

## 2020-07-04 NOTE — Patient Instructions (Signed)
Your health issues we discussed today were:   GERD (reflux/heartburn): 1. Glad your symptoms are doing better 2. Continue taking your current medications 3. Let us know if you have any worsening or severe symptoms  Abdominal pain: 1. This could be related to your GERD as well as all the stress you have been under recently 2. Continue taking your heartburn medicines as per above 3. Call us if they have any worsening abdominal pain 4. You can try to taper down on the Carafate to twice a day until you run out and then stop 5. If your symptoms or abdominal pain get worse after you run out of and stop taking Carafate, call our office  Overall I recommend:  1. Continue your other current medications 2. Return for follow-up in 3 months 3. Call us for any questions or concerns   ---------------------------------------------------------------  I am glad you have gotten your COVID-19 vaccination!  Even though you are fully vaccinated you should continue to follow CDC and state/local guidelines.  ---------------------------------------------------------------   At [facility] we value your feedback. You may receive a survey about your visit today. Please share your experience as we strive to create trusting relationships with our patients to provide genuine, compassionate, quality care.  We appreciate your understanding and patience as we review any laboratory studies, imaging, and other diagnostic tests that are ordered as we care for you. Our office policy is 5 business days for review of these results, and any emergent or urgent results are addressed in a timely manner for your best interest. If you do not hear from our office in 1 week, please contact us.   We also encourage the use of MyChart, which contains your medical information for your review as well. If you are not enrolled in this feature, an access code is on this after visit summary for your convenience. Thank you  for allowing [place] to be involved in your care.  It was great to see you today!  I hope you have a Happy Thanksgiving!!

## 2020-07-04 NOTE — Progress Notes (Signed)
Referring Provider: Adela Glimpse, NP Primary Care Physician:  Adela Glimpse, NP Primary GI:  Dr. Marletta Lor  Chief Complaint  Patient presents with  . Abdominal Pain    across upper abd    HPI:   Steve Nunez is a 54 y.o. adult who presents for follow-up on abdominal pain and GERD.  Patient was last seen in our office 05/09/2020 for generalized abdominal pain, nausea and vomiting, GERD.  Recent ER visit for nausea, vomiting, dehydration and mild AKI with creatinine 1.34.  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  Creatinine improved with fluids, 8 in the ER without vomiting and was discharged home.  His last visit feels symptoms are worse with near constant generalized dull, crampy abdominal pain typically worse after eating.  Also postprandial nausea and occasional vomiting.  Poor oral intake for the past 2 weeks.  Is trying to push fluids.  Has given up carbonated drinks.  Chronic GERD symptoms on Pepcid (previously on omeprazole but insurance required try/fail famotidine first).  GERD was well managed on omeprazole.  Daily multiple episodes of GERD/reflux.  No other overt GI complaints.  Recommended CBC, CMP, H. pylori breath test, stop famotidine and start Protonix 40 mg twice daily after breath test, call for any further problems, continue to push fluids, EGD with H. pylori biopsy, follow-up in 6 months.  CBC completed 05/09/2020 found essentially normal, CMP at same time found stable creatinine of 1.32, normal LFTs.  Recommended forwarding of labs to primary care for elevated creatinine follow-up.  Patient underwent EGD 05/13/2020 which found LA grade a reflux esophagitis without bleeding, gastritis status post biopsy, duodenitis status post biopsy.  Surgical pathology found the duodenal biopsies to be acute nonspecific duodenitis with focal erosion in the gastric biopsy to be  gastric antral mucosa with mild nonspecific reactive gastropathy negative for H. pylori.  Recommended Protonix 40 mg twice daily for 3 months then back off to once daily.  Today states he is doing okay overall. He was recently admitted for perirectal abscess and had surical correction. Healing well but has had a lot of fatigue given hospitalization, surgery, etc. He has a surgical follow-up in 2 weeks. GERD symptoms doing well overall. Still with some upper abdominal soreness, decreased appetite which he attributes to back-to-back health issues and surgery. Has had some decreased weight with his poor appetite. Notes early satiety, takes a couple bites and then feels full. Tries to eat a lot of greek yogurt and protein shakes ([employer]) to keep nutrition up. Is also going to take a multivitamin. Denies N/V, hematochezia, melena, fever, chills. Denies URI or flu-like symptoms. Denies loss of sense of taste or smell. The patient has not received COVID-19 vaccination(s). Denies chest pain, dyspnea, dizziness, lightheadedness, syncope, near syncope. Denies any other upper or lower GI symptoms.  Had a colonoscopy a few years ago which was normal and recommended 10 year repeat exam.  Past Medical History:  Diagnosis Date  . Bipolar 1 disorder (HCC)   . CHF (congestive heart failure) (HCC)   . DDD (degenerative disc disease), cervical   . Depression   . Diabetes mellitus without complication (HCC)   . GERD (gastroesophageal reflux disease)   . Hypercholesterolemia   . Hyperlipidemia   . Hypertension   . only has 1 kidney    one kidney  . Parkinson's disease (HCC) 2010    Past Surgical History:  Procedure Laterality Date  . BIOPSY  05/13/2020   Procedure: BIOPSY;  Surgeon: Lanelle Balarver, Charles K, DO;  Location: [facility];  Service: Endoscopy;;  . BYPASS AXILLA/BRACHIAL ARTERY Right 1981  . CARPAL TUNNEL RELEASE Right 2010  . ESOPHAGOGASTRODUODENOSCOPY (EGD) WITH PROPOFOL N/A 05/13/2020    Procedure: ESOPHAGOGASTRODUODENOSCOPY (EGD) WITH PROPOFOL;  Surgeon: Lanelle Balarver, Charles K, DO;  Location: [facility];  Service: Endoscopy;  Laterality: N/A;  12:30pm  . EVALUATION UNDER ANESTHESIA WITH ANAL FISTULECTOMY N/A 06/19/2020   Procedure: INCISION AND DRAINAGE OF PERIANAL ABSCESS  WITH DRAIN PLACEMENT AND FISTULECTOMY;  Surgeon: Lucretia RoersBridges, Lindsay C, MD;  Location: [facility] ORS;  Service: General;  Laterality: N/A;  . NECK SURGERY  2007, 2010, 2013   C3-C7 ant/post fusion, DDD  . TESTICLE REMOVAL Left 1981    Current Outpatient Medications  Medication Sig Dispense Refill  . acarbose (PRECOSE) 50 MG tablet Take 50 mg by mouth in the morning, at noon, and at bedtime.    Marland Kitchen. amLODipine (NORVASC) 10 MG tablet Take 10 mg every evening by mouth.   0  . ARIPiprazole (ABILIFY) 30 MG tablet Take 30 mg by mouth daily.     Marland Kitchen. atorvastatin (LIPITOR) 80 MG tablet Take 80 mg by mouth daily.   0  . carbidopa-levodopa (SINEMET IR) 25-100 MG tablet Take 2.5 tablets by mouth in the morning, at noon, and at bedtime.     . fenofibrate (TRICOR) 145 MG tablet Take 145 mg by mouth every evening.   5  . glimepiride (AMARYL) 1 MG tablet Take 1 mg by mouth daily.   5  . hydrochlorothiazide (HYDRODIURIL) 25 MG tablet Take 25 mg by mouth daily.    Marland Kitchen. levothyroxine (SYNTHROID, LEVOTHROID) 50 MCG tablet Take 50 mcg by mouth daily before breakfast.   0  . lisinopril (PRINIVIL,ZESTRIL) 20 MG tablet Take 20 mg by mouth in the morning. Take with 5 mg for a total of 25 mg daily  0  . lisinopril (PRINIVIL,ZESTRIL) 5 MG tablet Take 5 mg by mouth in the morning. Take with 20 mg for a total of 25 mg  0  . metFORMIN (GLUCOPHAGE) 500 MG tablet Take 1,000 mg by mouth 2 (two) times daily with a meal.   0  . nystatin ointment (MYCOSTATIN) Apply 1 application topically daily as needed for mouth pain.    Marland Kitchen. omega-3 acid ethyl esters (LOVAZA) 1 G capsule Take 1 capsule by mouth 4 (four) times daily.  4  . ondansetron (ZOFRAN-ODT) 4 MG  disintegrating tablet Take 4 mg by mouth 2 (two) times daily as needed for nausea or vomiting.     . Oxycodone HCl 10 MG TABS Take 10 mg by mouth 3 (three) times daily as needed for moderate pain or severe pain.     . pantoprazole (PROTONIX) 40 MG tablet Take 1 tablet (40 mg total) by mouth 2 (two) times daily before a meal. 60 tablet 3  . rOPINIRole (REQUIP) 2 MG tablet Take 2 mg by mouth 3 (three) times daily.  0  . sucralfate (CARAFATE) 1 g tablet TAKE 1 TABLET(1 GRAM) BY MOUTH FOUR TIMES DAILY 360 tablet 1  . trihexyphenidyl (ARTANE) 2 MG tablet Take 1 mg by mouth 3 (three) times daily.   0   No current facility-administered medications for this visit.    Allergies as of 07/04/2020 - Review Complete 07/04/2020  Allergen Reaction Noted  . Depakote [divalproex sodium] Other (See Comments) 04/02/2015  . Sulfa antibiotics Hives 02/09/2015  Family History  Problem Relation Age of Onset  . Hypertension Father     Social History   Socioeconomic History  . Marital status: Married    Spouse name: Not on file  . Number of children: 6  . Years of education: GED  . Highest education level: Not on file  Occupational History    Comment: disabled  Tobacco Use  . Smoking status: Current Every Day Smoker    Packs/day: 0.50    Years: 30.00    Pack years: 15.00    Types: Cigarettes  . Smokeless tobacco: Never Used  Vaping Use  . Vaping Use: Never used  Substance and Sexual Activity  . Alcohol use: No  . Drug use: No  . Sexual activity: Not on file  Other Topics Concern  . Not on file  Social History Narrative   Married, lives at home with wife and family   Caffeine use - sodas 5-6 daily   Social Determinants of Health   Financial Resource Strain:   . Difficulty of Paying Living Expenses: Not on file  Food Insecurity:   . Worried About Programme researcher, broadcasting/film/video in the Last Year: Not on file  . Ran Out of Food in the Last Year: Not on file  Transportation Needs:   . Lack of  Transportation (Medical): Not on file  . Lack of Transportation (Non-Medical): Not on file  Physical Activity:   . Days of Exercise per Week: Not on file  . Minutes of Exercise per Session: Not on file  Stress:   . Feeling of Stress : Not on file  Social Connections:   . Frequency of Communication with Friends and Family: Not on file  . Frequency of Social Gatherings with Friends and Family: Not on file  . Attends Religious Services: Not on file  . Active Member of Clubs or Organizations: Not on file  . Attends Banker Meetings: Not on file  . Marital Status: Not on file    Subjective: Review of Systems  Constitutional: Negative for chills, fever, malaise/fatigue and weight loss.  HENT: Negative for congestion and sore throat.   Respiratory: Negative for cough and shortness of breath.   Cardiovascular: Negative for chest pain and palpitations.  Gastrointestinal: Positive for abdominal pain and heartburn. Negative for blood in stool, diarrhea, melena, nausea and vomiting.  Musculoskeletal: Negative for joint pain and myalgias.  Skin: Negative for rash.  Neurological: Negative for dizziness and weakness.  Endo/Heme/Allergies: Does not bruise/bleed easily.  Psychiatric/Behavioral: Negative for depression. The patient is not nervous/anxious.   All other systems reviewed and are negative.    Objective: BP 116/73   Pulse (!) 102   Temp (!) 97.1 F (36.2 C) (Temporal)   Ht 5\' 7"  (1.702 m)   Wt 187 lb 12.8 oz (85.2 kg)   BMI 29.41 kg/m  Physical Exam Vitals and nursing note reviewed.  Constitutional:      General: He is not in acute distress.    Appearance: Normal appearance. He is well-developed and normal weight. He is not ill-appearing, toxic-appearing or diaphoretic.  HENT:     Head: Normocephalic and atraumatic.     Nose: No congestion or rhinorrhea.  Eyes:     General: No scleral icterus. Cardiovascular:     Rate and Rhythm: Normal rate and regular  rhythm.     Heart sounds: Normal heart sounds.  Pulmonary:     Effort: Pulmonary effort is normal.     Breath sounds: Normal  breath sounds.  Abdominal:     General: Bowel sounds are normal. There is no distension.     Palpations: Abdomen is soft. There is no hepatomegaly, splenomegaly or mass.     Tenderness: There is no abdominal tenderness. There is no guarding or rebound.     Hernia: No hernia is present.  Musculoskeletal:     Cervical back: Neck supple.  Skin:    General: Skin is warm and dry.     Coloration: Skin is not jaundiced.     Findings: No bruising or rash.  Neurological:     General: No focal deficit present.     Mental Status: He is alert and oriented to person, place, and time. Mental status is at baseline.  Psychiatric:        Mood and Affect: Mood normal.        Behavior: Behavior normal.        Thought Content: Thought content normal.      Assessment:  Pleasant 54 year old male presents for follow-up on abdominal pain and GERD.  EGD on file with grade a reflux esophagitis recommended Protonix 40 mg twice daily for 3 months then back off to once daily.  Currently doing well clinically.  No red flag/warning signs or symptoms.  He was recently admitted for perirectal abscess with surgical correction and with associated fatigue given hospitalization, surgery, etc.  Has surgical follow-up planned for 2 weeks.  GERD: Overall improved, still with some mild upper abdominal tenderness and decreased appetite which he attributes to recent acute illness.  Some decreased weight given his poor appetite as well.  At this point I will have him continue his PPI.  Notify us of any worsening symptoms  Abdominal pain: Overall mostly improved with improvement in GERD symptoms, still some abdominal soreness in the upper abdomen.  Likely related to some residual GERD as well as recent acute illness and stress.  Overall doing pretty well.  He notes a recent colonoscopy within the past few  years and we will request records for this.  Notify us of worsening, continue meds and follow-up   Plan: 1. Continue current medications 2. We will request previous colonoscopy 3. Notify us of any worsening symptoms 4. Follow-up in 3 months otherwise    Thank you for allowing [place] to participate in the care of Steve Nunez  Wynne Dust, DNP, AGNP-C Adult & Gerontological Nurse Practitioner [facility]   07/04/2020 3:28 PM   Disclaimer: This note was dictated with voice recognition software. Similar sounding words can inadvertently be transcribed and may not be corrected upon review.

## 2020-07-09 NOTE — Progress Notes (Signed)
Cc'ed to pcp °

## 2020-07-18 ENCOUNTER — Ambulatory Visit (INDEPENDENT_AMBULATORY_CARE_PROVIDER_SITE_OTHER): Payer: Medicare Other | Admitting: General Surgery

## 2020-07-18 ENCOUNTER — Other Ambulatory Visit: Payer: Self-pay

## 2020-07-18 ENCOUNTER — Encounter: Payer: Self-pay | Admitting: General Surgery

## 2020-07-18 VITALS — BP 103/69 | HR 96 | Temp 98.5°F | Resp 18 | Ht 67.0 in | Wt 188.0 lb

## 2020-07-18 DIAGNOSIS — K603 Anal fistula: Secondary | ICD-10-CM

## 2020-07-18 DIAGNOSIS — K611 Rectal abscess: Secondary | ICD-10-CM

## 2020-07-18 NOTE — Patient Instructions (Signed)
Continue peroxide to area. Continue sitz baths as needed.

## 2020-07-18 NOTE — Progress Notes (Signed)
[facility] Note   HPI:  54 y.o. Male presents to clinic for post-op follow-up evaluation after I&D of abscess and fistulotomy. He is doing well and having less drainage. He says areas are healing. He is doing peroxide to them daily.   Review of Systems:  No fever or chills Reduced drainage  All other review of systems: otherwise negative   Vital Signs:  BP 103/69   Pulse 96   Temp 98.5 F (36.9 C) (Oral)   Resp 18   Ht 5\' 7"  (1.702 m)   Wt 188 lb (85.3 kg)   SpO2 96%   BMI 29.44 kg/m    Physical Exam:  Physical Exam Vitals reviewed.  Cardiovascular:     Rate and Rhythm: Normal rate.  Pulmonary:     Effort: Pulmonary effort is normal.  Genitourinary:    Comments: I&D sites healing and no tracking, granulation at base, about 1cm in size each, fistulotomy site healing, about 1.5cm in size, granulation at base, silver nitrate applied to all three, no erythema or drainage   Assessment:  54 y.o. yo Male s/p I& D and fistulotomy doing well overall. Healing but will take some time.   Plan:  Continue peroxide to area. Continue sitz baths as needed.   Future Appointments  Date Time Provider Department Center  08/01/2020  2:15 PM 14/10/2019, MD RS-RS None  10/08/2020  3:00 PM 12/06/2020, NP [facility]     [facility], MD [facility] [address] [place], [place] [phone] (office)

## 2020-08-01 ENCOUNTER — Other Ambulatory Visit: Payer: Self-pay

## 2020-08-01 ENCOUNTER — Encounter: Payer: Self-pay | Admitting: General Surgery

## 2020-08-01 ENCOUNTER — Ambulatory Visit (INDEPENDENT_AMBULATORY_CARE_PROVIDER_SITE_OTHER): Payer: Medicare Other | Admitting: General Surgery

## 2020-08-01 VITALS — BP 137/79 | HR 97 | Temp 98.5°F | Resp 16 | Ht 67.0 in | Wt 194.0 lb

## 2020-08-01 DIAGNOSIS — K611 Rectal abscess: Secondary | ICD-10-CM

## 2020-08-01 DIAGNOSIS — K603 Anal fistula: Secondary | ICD-10-CM

## 2020-08-01 NOTE — Progress Notes (Signed)
[facility] Note   HPI:  54 y.o. Male presents to clinic for post-op follow-up evaluation of his perianal fistulotomy and abscess drainage. He has been doing peroxide to the wound and has minimal to no drainage.   Review of Systems:  No erythema No pain Minimal drainage All other review of systems: otherwise negative   Vital Signs:  BP 137/79   Pulse 97   Temp 98.5 F (36.9 C) (Oral)   Resp 16   Ht 5\' 7"  (1.702 m)   Wt 194 lb (88 kg)   SpO2 96%   BMI 30.38 kg/m    Physical Exam:  Physical Exam Vitals reviewed.  Cardiovascular:     Rate and Rhythm: Normal rate.  Pulmonary:     Effort: Pulmonary effort is normal.  Genitourinary:    Comments: I&D site at midline perineum with superior opening closed, inferior opening about 1cm in size, silver nitrate applied, fistulotomy site about 1cm opening with skin edges, silver nitrate applied, some induration, no erythema, nontender    Assessment:  54 y.o. yo Male with healing wounds from I&D and fistulotomy. Doing well overall.  Plan:  Continue peroxide to the area.  Keep area clean and dry otherwise.   Future Appointments  Date Time Provider Department Center  09/05/2020 11:45 AM 11/03/2020, MD RS-RS None  10/08/2020  3:00 PM 12/06/2020, NP [facility]      [facility], MD [facility] [address] [place], [place] [phone] (office)

## 2020-08-01 NOTE — Patient Instructions (Signed)
Continue peroxide to the area.  Keep area clean and dry otherwise.

## 2020-09-05 ENCOUNTER — Other Ambulatory Visit: Payer: Self-pay

## 2020-09-05 ENCOUNTER — Encounter: Payer: Self-pay | Admitting: General Surgery

## 2020-09-05 ENCOUNTER — Ambulatory Visit: Payer: Medicare Other | Admitting: General Surgery

## 2020-09-05 ENCOUNTER — Ambulatory Visit (INDEPENDENT_AMBULATORY_CARE_PROVIDER_SITE_OTHER): Payer: Medicare Other | Admitting: General Surgery

## 2020-09-05 VITALS — BP 129/84 | HR 84 | Temp 97.2°F | Resp 16 | Ht 67.0 in | Wt 192.0 lb

## 2020-09-05 DIAGNOSIS — K611 Rectal abscess: Secondary | ICD-10-CM

## 2020-09-05 DIAGNOSIS — K603 Anal fistula: Secondary | ICD-10-CM

## 2020-09-05 NOTE — Progress Notes (Signed)
[facility] Note   HPI:  55 y.o. 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 Patient reports it has healed and he has no drainage.  Review of Systems:  No pain or drainage All other review of systems: otherwise negative   Vital Signs:  BP 129/84   Pulse 84   Temp (!) 97.2 F (36.2 C) (Oral)   Resp 16   Ht 5\' 7"  (1.702 m)   Wt 192 lb (87.1 kg)   SpO2 95%   BMI 30.07 kg/m    Physical Exam:  Physical Exam Cardiovascular:     Rate and Rhythm: Normal rate.  Pulmonary:     Effort: Pulmonary effort is normal.  Genitourinary:    Comments: Healed perianal incisions, some extra skin flaps but all epithelized Neurological:     Mental Status: He is alert.    Assessment:  55 y.o. yo Male with healed perianal fistula site and I&D site.  Plan:  - Keep stools soft  - Call with issues  - Skin flaps/ indentation from incisions should continue to superficialize/ edges soften   Prn follow up  40, MD [facility] [address] [place], [place] [phone] (office)

## 2020-09-14 ENCOUNTER — Other Ambulatory Visit: Payer: Self-pay | Admitting: Nurse Practitioner

## 2020-09-14 DIAGNOSIS — K219 Gastro-esophageal reflux disease without esophagitis: Secondary | ICD-10-CM

## 2020-09-14 DIAGNOSIS — R1084 Generalized abdominal pain: Secondary | ICD-10-CM

## 2020-09-14 DIAGNOSIS — R112 Nausea with vomiting, unspecified: Secondary | ICD-10-CM

## 2020-10-08 ENCOUNTER — Ambulatory Visit: Payer: Medicare Other | Admitting: Gastroenterology

## 2020-10-08 ENCOUNTER — Ambulatory Visit: Payer: Medicare Other | Admitting: Nurse Practitioner

## 2020-10-08 ENCOUNTER — Encounter: Payer: Self-pay | Admitting: Internal Medicine

## 2020-10-08 NOTE — Progress Notes (Deleted)
EGD 05/13/2020 which found LA grade a reflux esophagitis without bleeding, gastritis status post biopsy, duodenitis status post biopsy.  Surgical pathology found the duodenal biopsies to be acute nonspecific duodenitis with focal erosion in the gastric biopsy to be gastric antral mucosa with mild nonspecific reactive gastropathy negative for H. Pylori.  Colonoscopy reports from outside:

## 2020-12-22 IMAGING — US US ABDOMEN LIMITED
1 series · 14 of 25 positions shown · non-contrast
Comparison: 04/26/2020

CLINICAL DATA: RIGHT upper quadrant and central abdominal pain for
1 week, history diabetes mellitus, hypertension, CHF, smoker

EXAM:
ULTRASOUND ABDOMEN LIMITED RIGHT UPPER QUADRANT

[Series 1: us abdomen limited ruq · 14 of 67 slices shown]
[im 1/67]
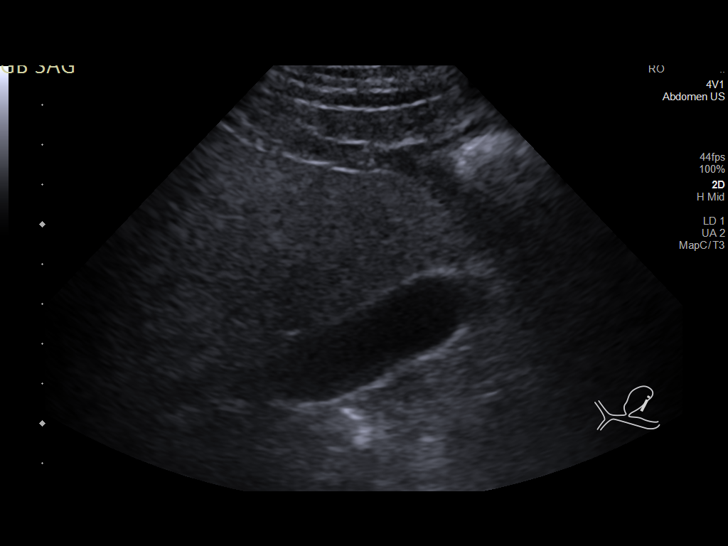
[im 6/67]
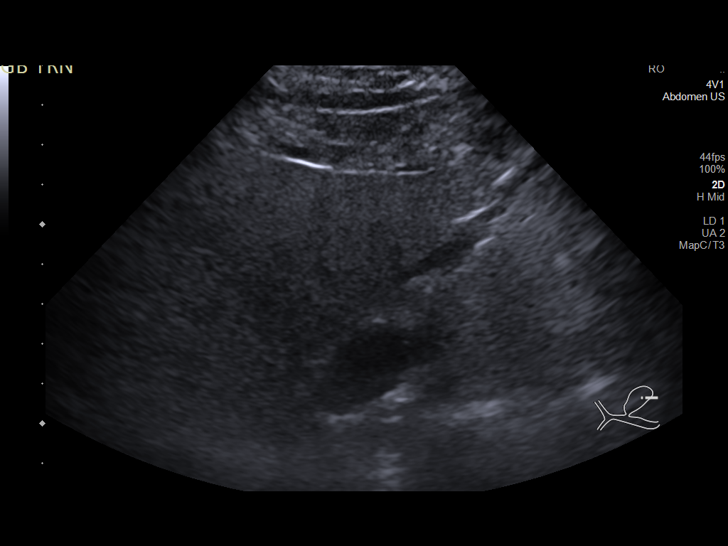
[im 12/67]
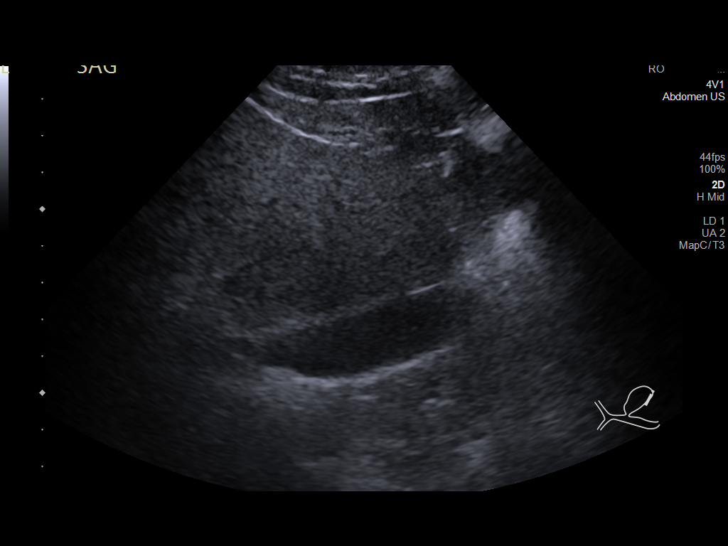
[im 17/67]
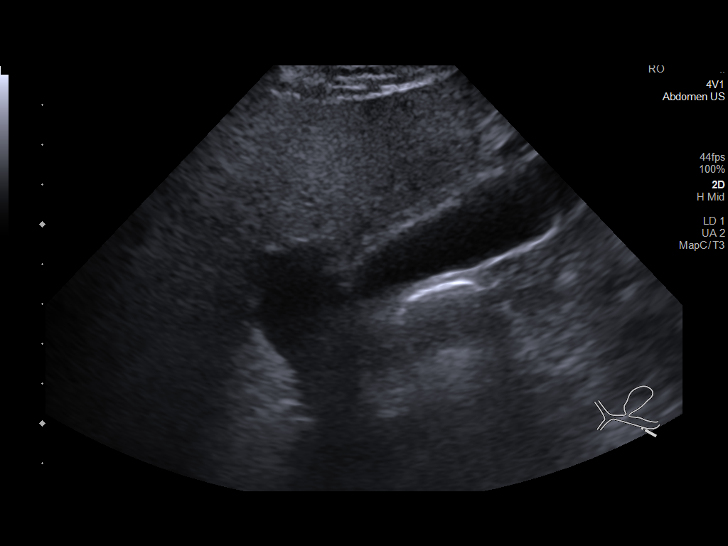
[im 23/67]
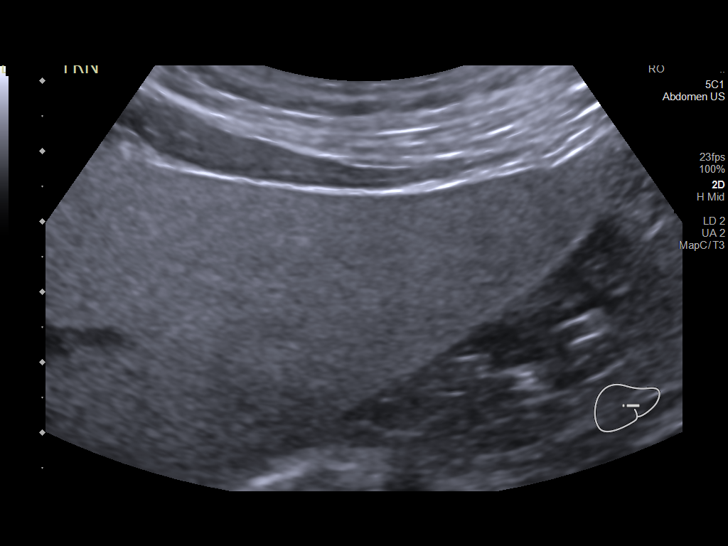
[im 25/67]
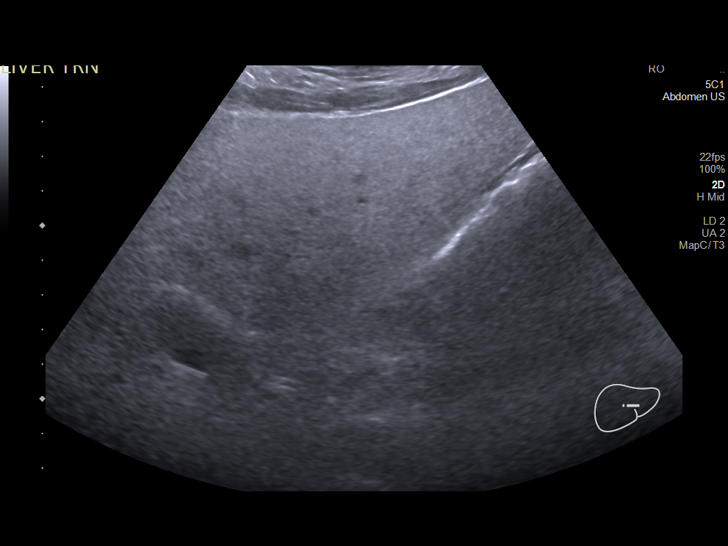
[im 31/67]
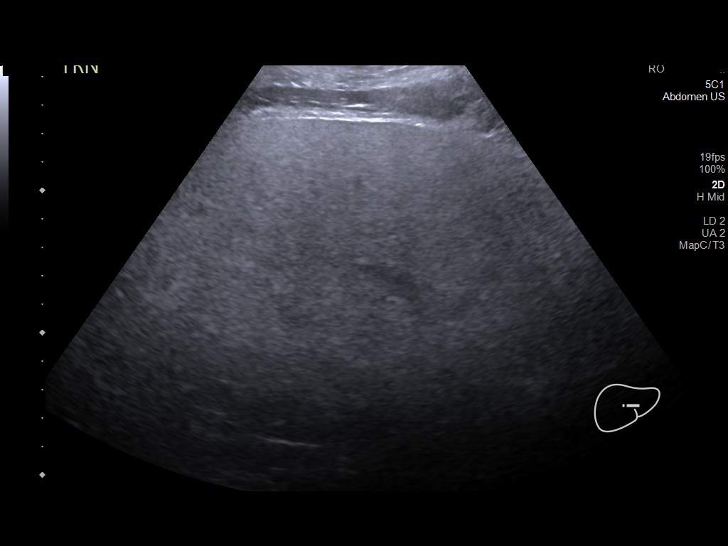
[im 36/67]
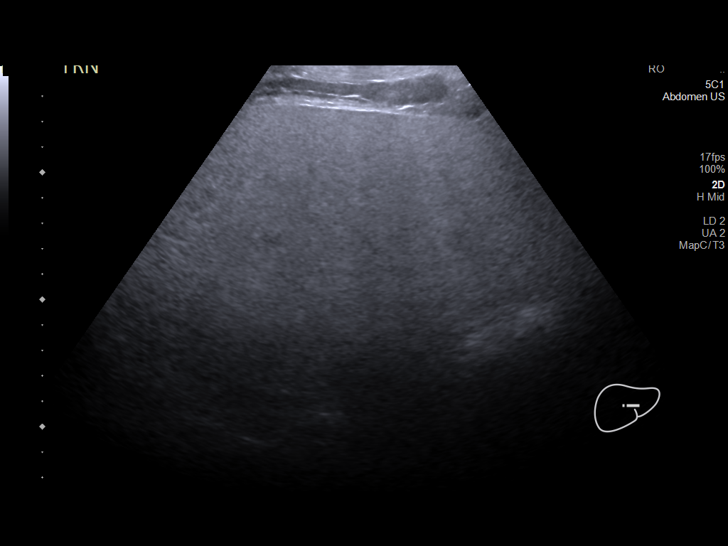
[im 42/67]
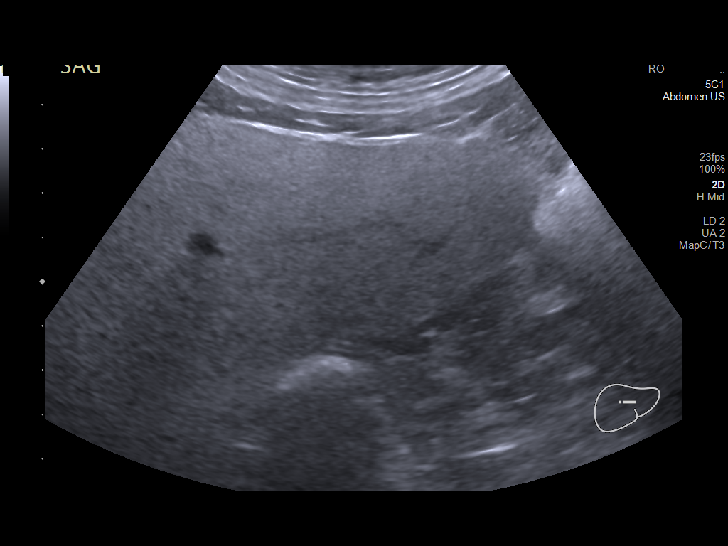
[im 45/67]
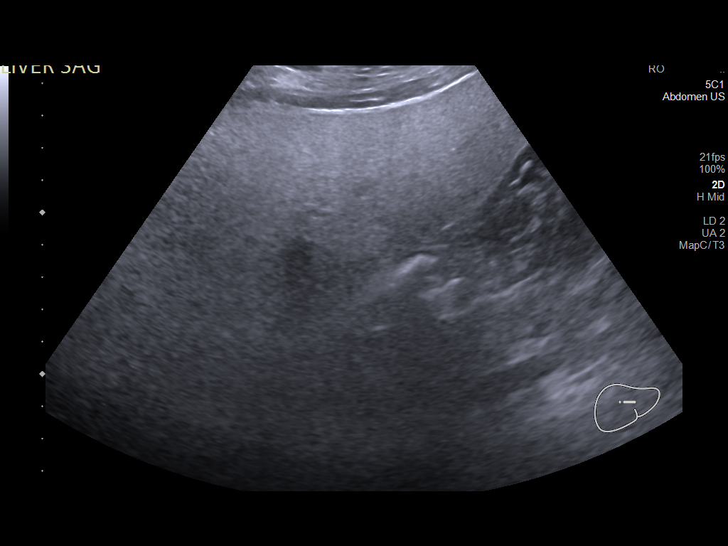
[im 50/67]
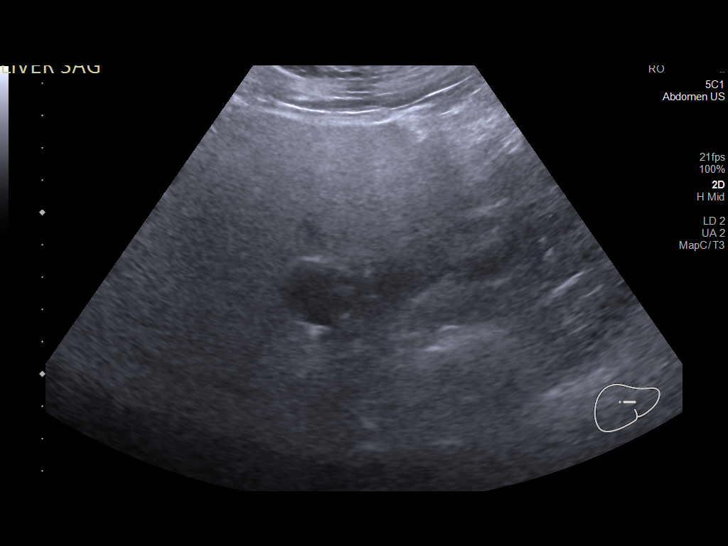
[im 56/67]
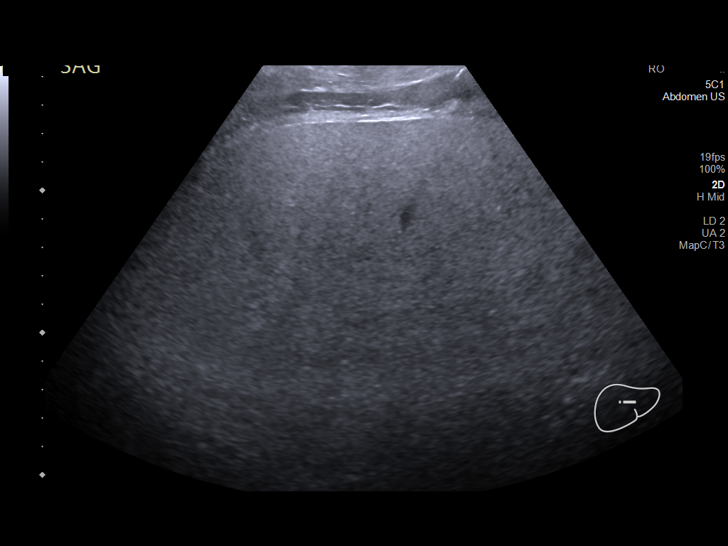
[im 61/67]
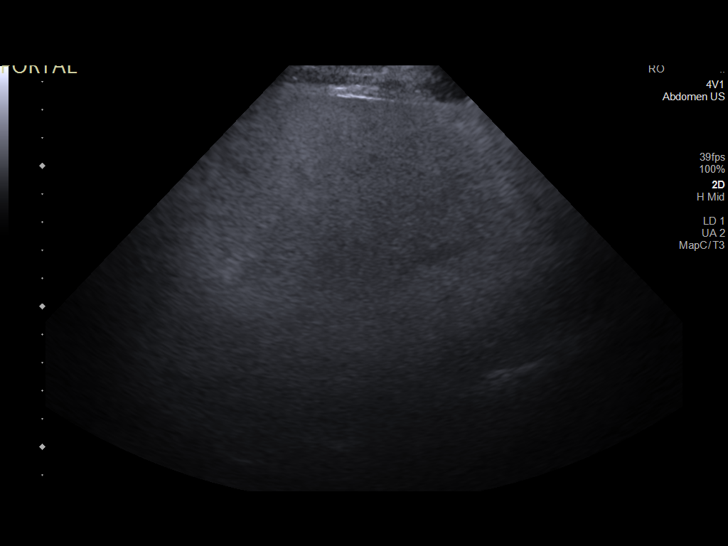
[im 67/67]
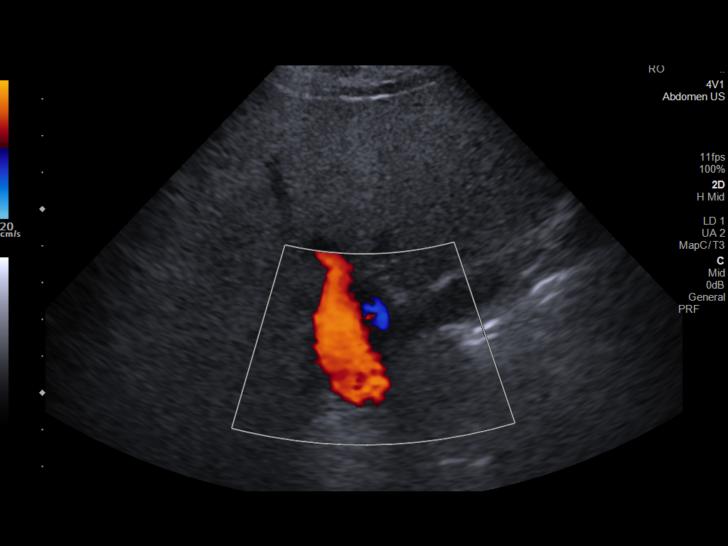

[14 of 25 positions shown; findings below may reference images not displayed]

FINDINGS: Gallbladder:

Normally distended without stones or wall thickening. No
pericholecystic fluid or sonographic Murphy sign.

Common bile duct:

Diameter: Unable to visualize due to bowel at porta hepatis despite
multiple changes in patient position. No intrahepatic biliary
dilatation identified.

Liver:

Echogenic parenchyma, likely fatty infiltration though this can be
seen with cirrhosis and certain infiltrative disorders. No focal
hepatic mass or nodularity. Smooth hepatic contour. Portal vein is
patent on color Doppler imaging with normal direction of blood flow
towards the liver. Adjacent LEFT portal vein.

Other: No RIGHT upper quadrant free fluid.
IMPRESSION: Probable fatty infiltration of liver as above.

Nonvisualization of CBD.

Unremarkable gallbladder.

## 2020-12-22 IMAGING — CT CT ABD-PELV W/ CM
2 of 5 series · 16 of 46 positions shown, 18 images · IV contrast (Omnipaque or Isovue)
Comparison: 10/18/2015

CLINICAL DATA: Right upper quadrant and mid abdominal pain

EXAM:
CT ABDOMEN AND PELVIS WITH CONTRAST
TECHNIQUE: Multidetector CT imaging of the abdomen and pelvis was performed
using the standard protocol following bolus administration of
intravenous contrast.
CONTRAST:  100mL OMNIPAQUE IOHEXOL 300 MG/ML  SOLN

[Series 2: axial st · axial · 0.74mm/px · z∈[-519,-79]mm · 13 of 100 slices shown, 15 images]
[im 6/100  soft-tissue]
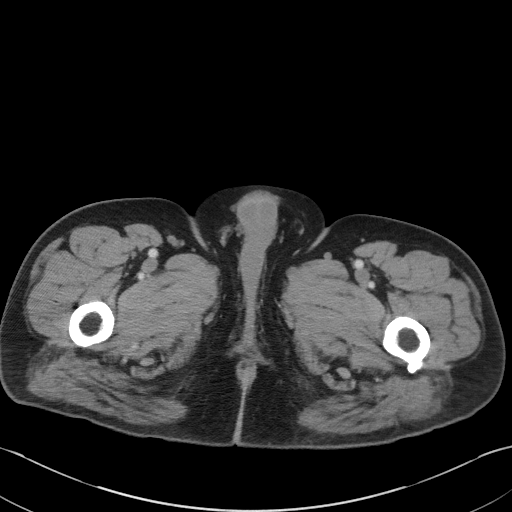
[im 6/100  bone]
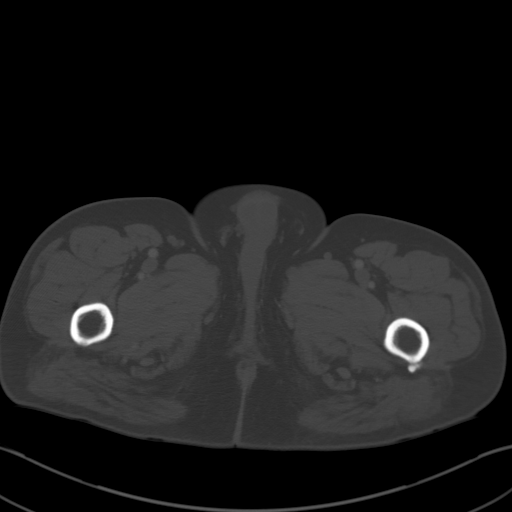
[im 16/100  soft-tissue]
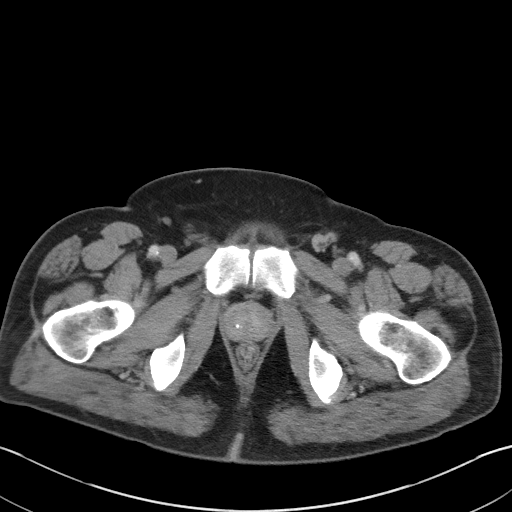
[im 21/100  soft-tissue]
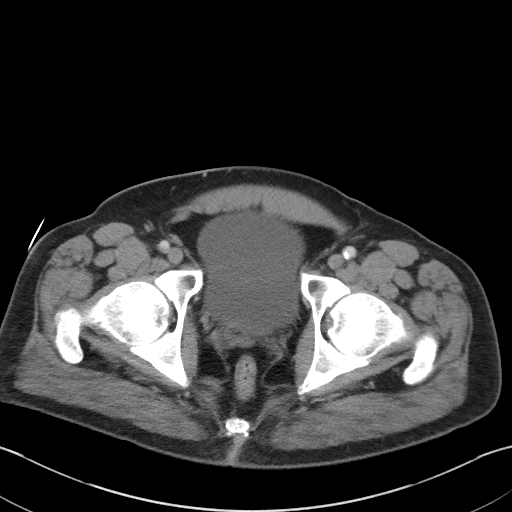
[im 27/100  soft-tissue]
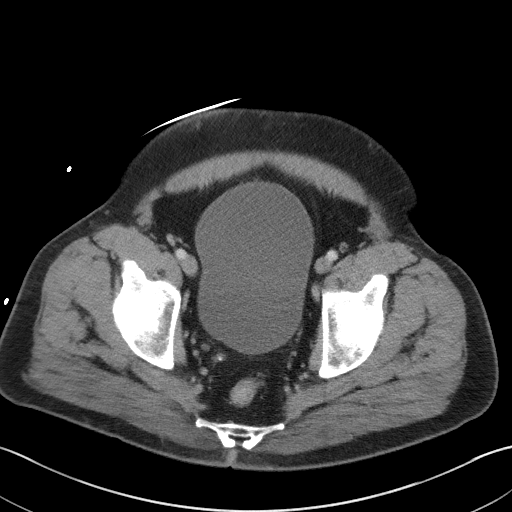
[im 37/100  soft-tissue]
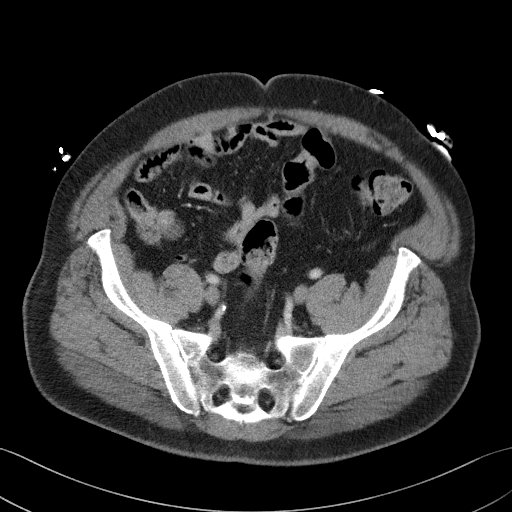
[im 42/100  soft-tissue]
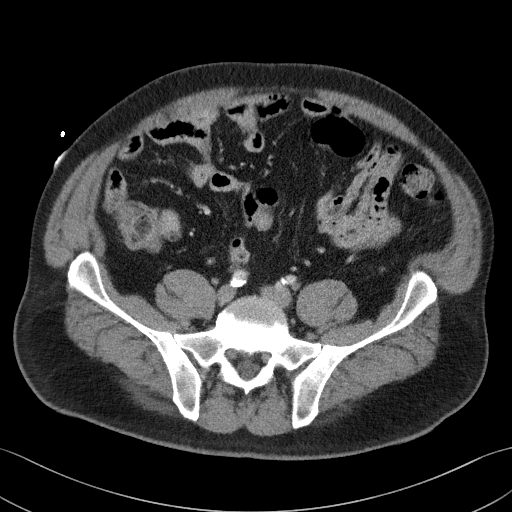
[im 53/100  soft-tissue]
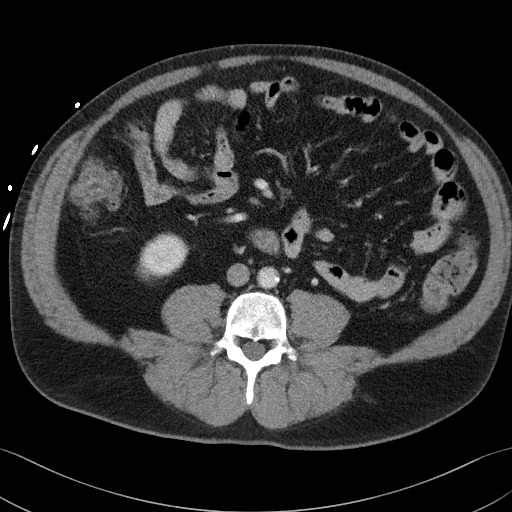
[im 58/100  soft-tissue]
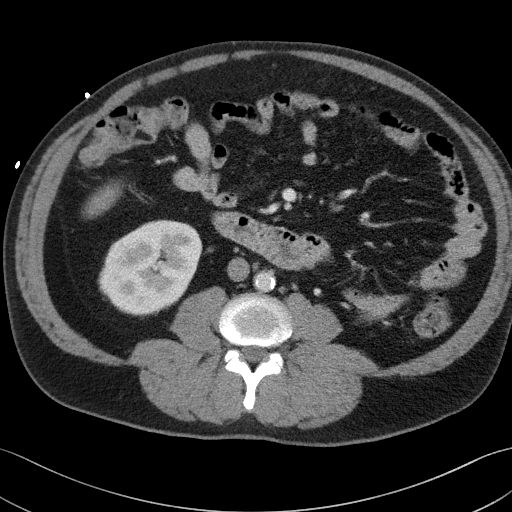
[im 63/100  soft-tissue]
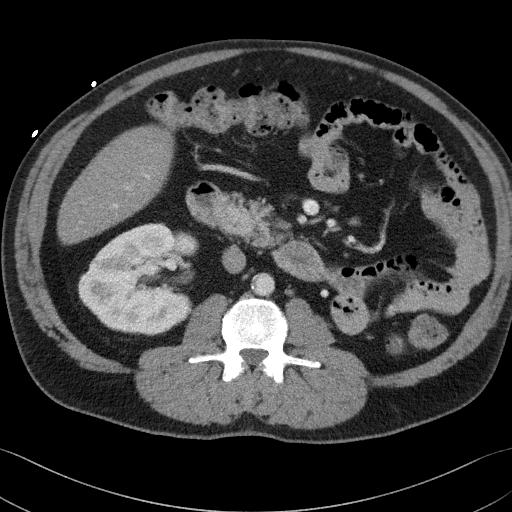
[im 63/100  bone]
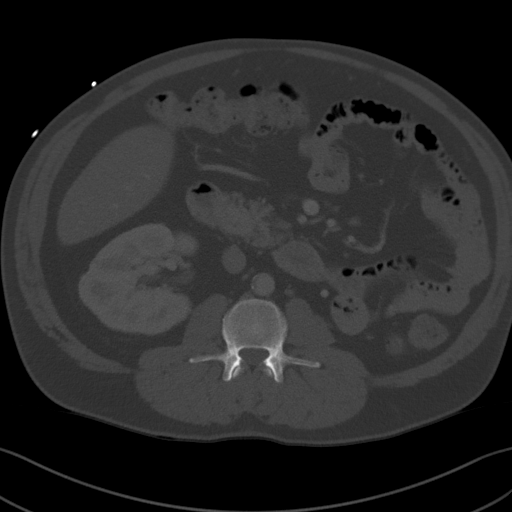
[im 73/100  soft-tissue]
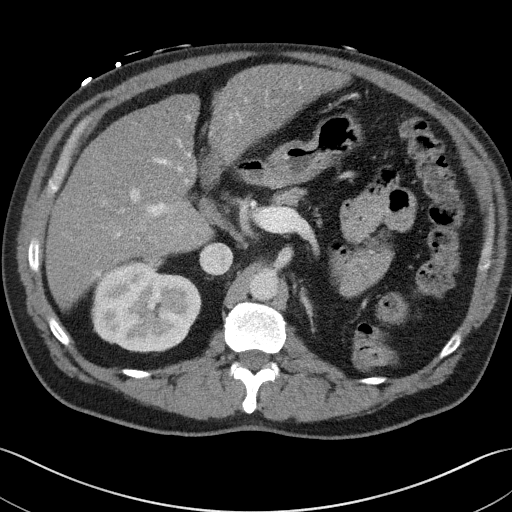
[im 79/100  soft-tissue]
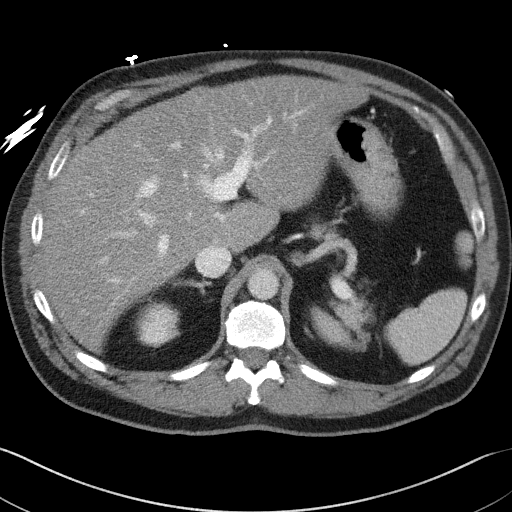
[im 84/100  soft-tissue]
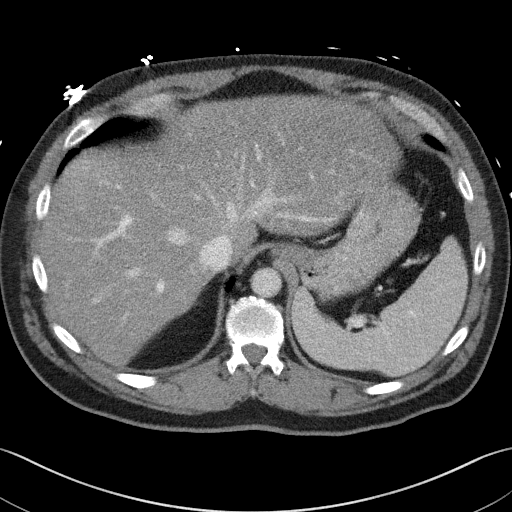
[im 94/100  soft-tissue]
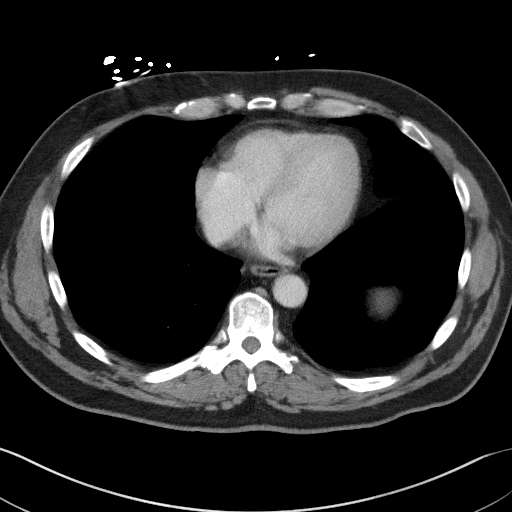

[Series 5: coronal st · coronal · 0.82mm/px · 3 of 100 slices shown]
[im 34/100  soft-tissue]
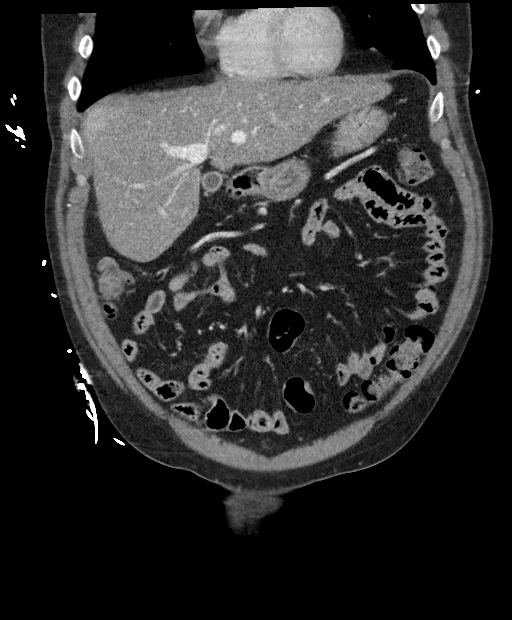
[im 45/100  soft-tissue]
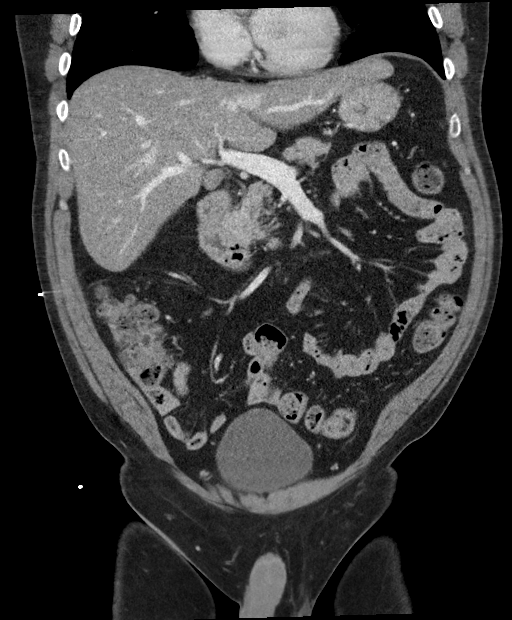
[im 56/100  soft-tissue]
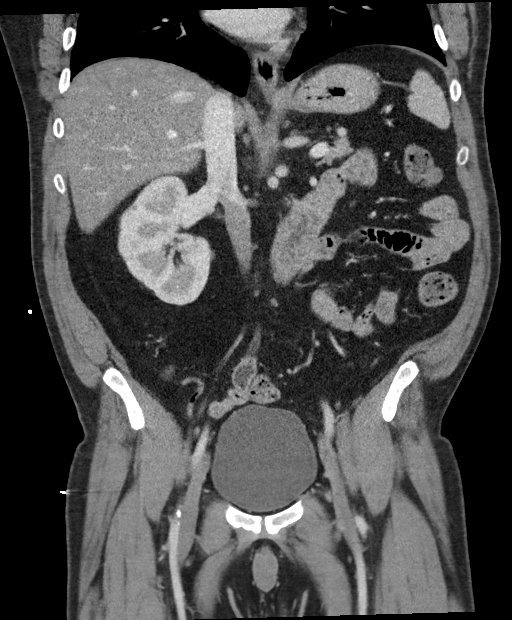

[16 of 46 positions shown; findings below may reference images not displayed]

FINDINGS: Lower chest: The visualized lung bases are clear bilaterally. The
visualized heart and pericardium are unremarkable.

Hepatobiliary: Moderate hepatic steatosis. No focal intrahepatic
mass identified. No intra or extrahepatic biliary ductal dilation.
The gallbladder is unremarkable.

Pancreas: Unremarkable

Spleen: Unremarkable

Adrenals/Urinary Tract: The adrenal glands are unremarkable. There
is congenital absence of the left kidney. There is compensatory
hypertrophy of the right kidney which is otherwise unremarkable. The
bladder is unremarkable.

Stomach/Bowel: The stomach, small bowel, and large bowel are
unremarkable. Appendix normal. No free intraperitoneal gas or fluid.

Vascular/Lymphatic: No pathologic adenopathy within the abdomen and
pelvis. The abdominal vasculature is age-appropriate with moderate
aortoiliac atherosclerotic calcification. No aneurysm.

Reproductive: Prostate is unremarkable.

Other: The rectum is unremarkable.

Musculoskeletal: No acute or significant osseous findings.
IMPRESSION: 1. No acute intra-abdominal or intrapelvic pathology.
2. Moderate hepatic steatosis.
3. Congenital absence of the left kidney with compensatory
hypertrophy of the right kidney.

Aortic Atherosclerosis ([id number]).

## 2021-01-26 IMAGING — CT CT PELVIS W/O CM
2 of 3 series · 16 of 46 positions shown, 18 images · non-contrast
Comparison: [DATE] [DATE], [DATE].  04/29/2020

CLINICAL DATA: Abscess x5 days.

EXAM:
CT PELVIS WITHOUT CONTRAST
TECHNIQUE: Multidetector CT imaging of the pelvis was performed following the
standard protocol without intravenous contrast.

[Series 2: axial st · axial · 0.74mm/px · z∈[+320,+610]mm · 13 of 68 slices shown, 15 images]
[im 5/68  soft-tissue]
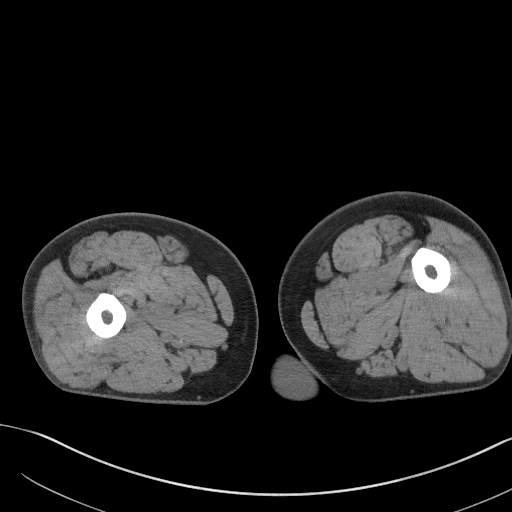
[im 5/68  bone]
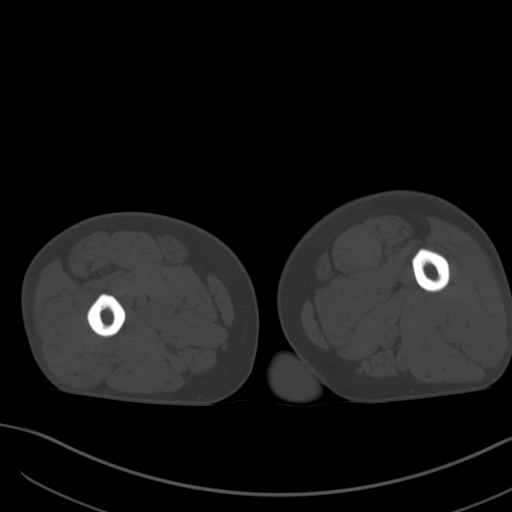
[im 9/68  soft-tissue]
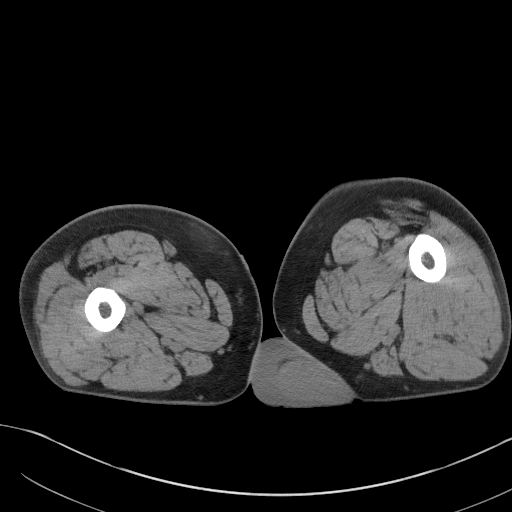
[im 13/68  soft-tissue]
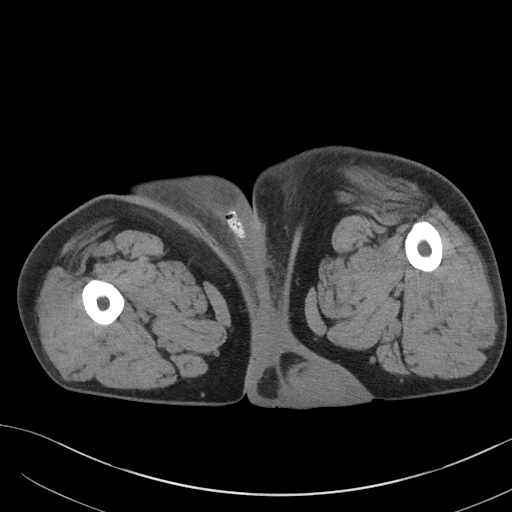
[im 20/68  soft-tissue]
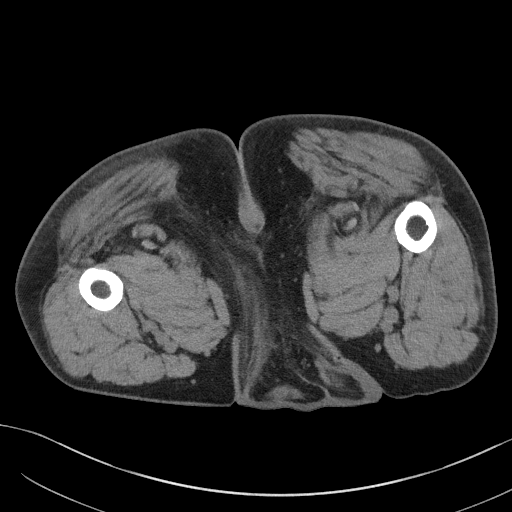
[im 24/68  soft-tissue]
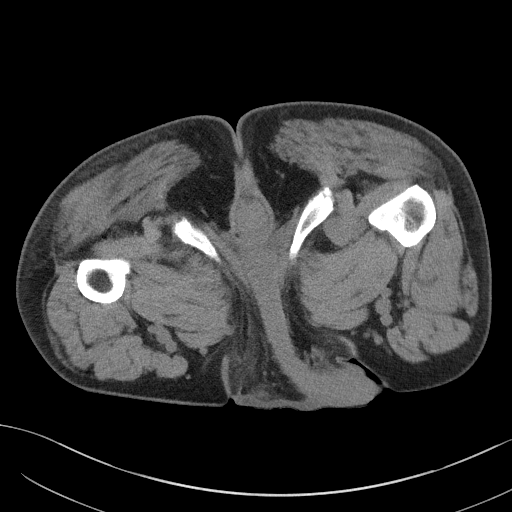
[im 29/68  soft-tissue]
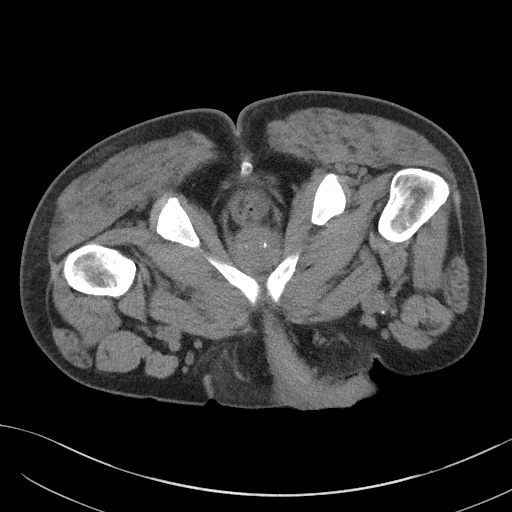
[im 35/68  soft-tissue]
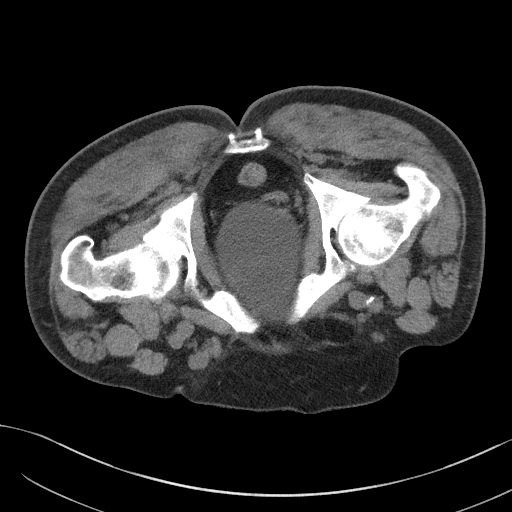
[im 39/68  soft-tissue]
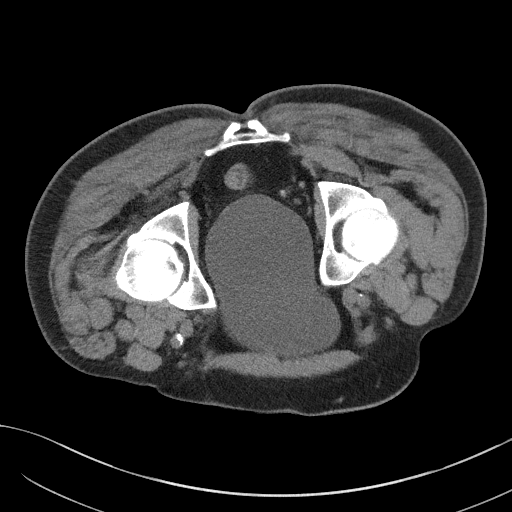
[im 44/68  soft-tissue]
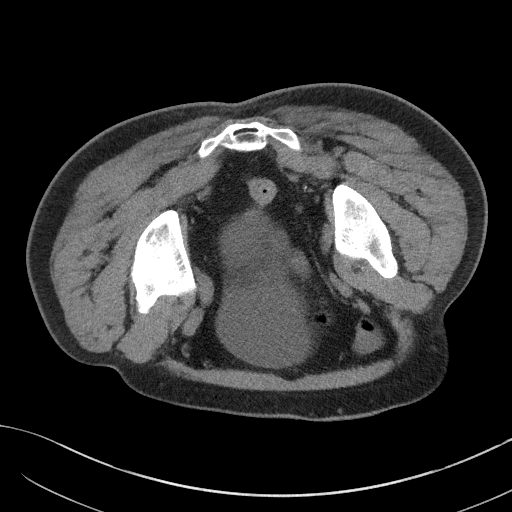
[im 44/68  bone]
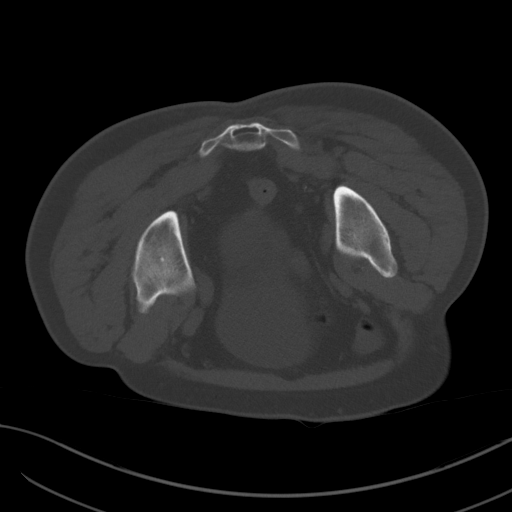
[im 48/68  soft-tissue]
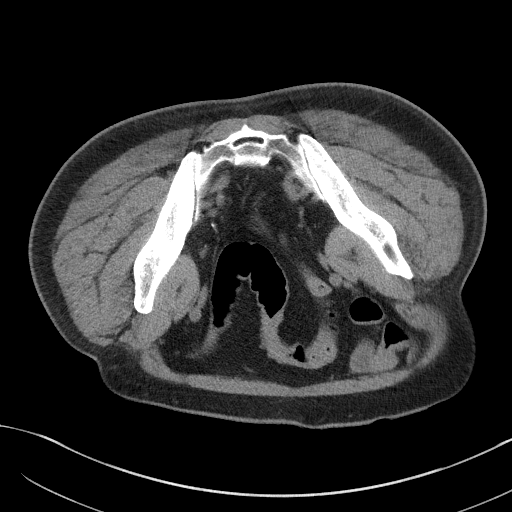
[im 55/68  soft-tissue]
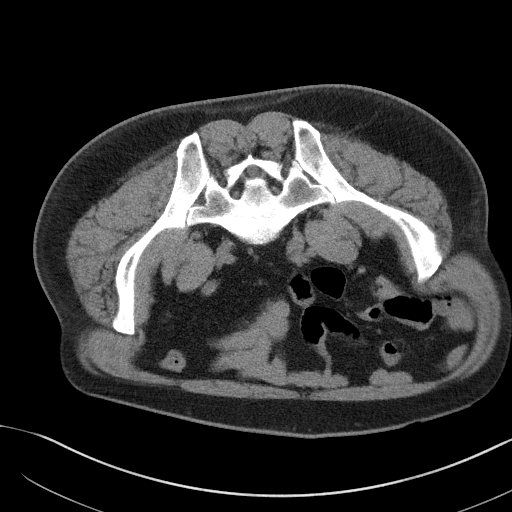
[im 59/68  soft-tissue]
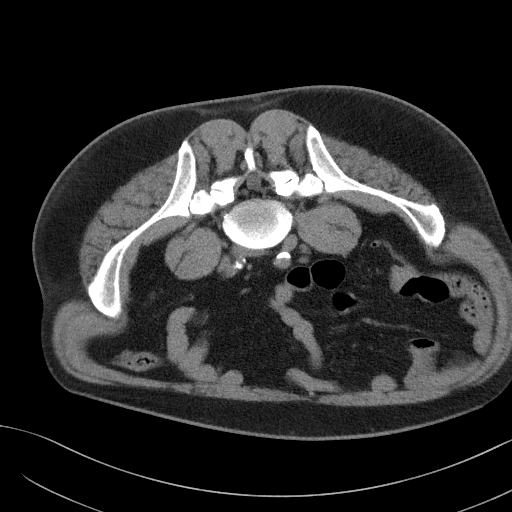
[im 63/68  soft-tissue]
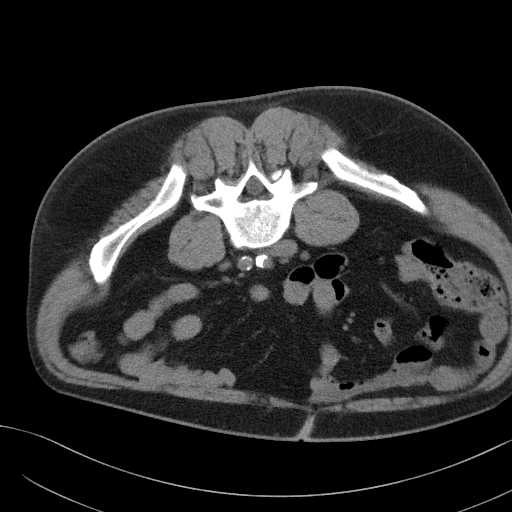

[Series 4: coronal st · coronal · 0.66mm/px · 3 of 94 slices shown]
[im 32/94  soft-tissue]
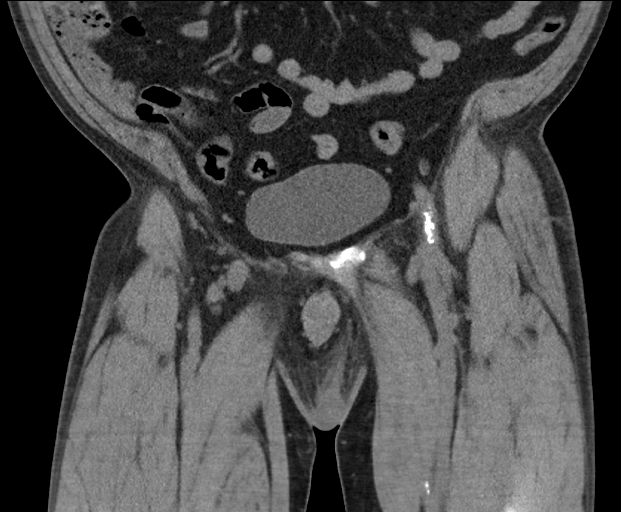
[im 42/94  soft-tissue]
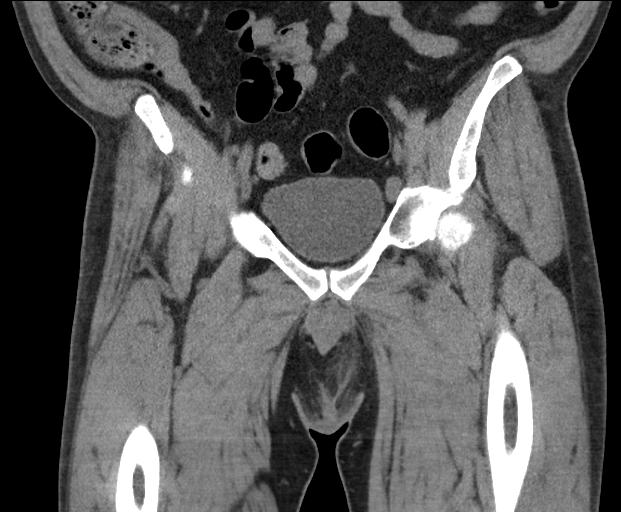
[im 52/94  soft-tissue]
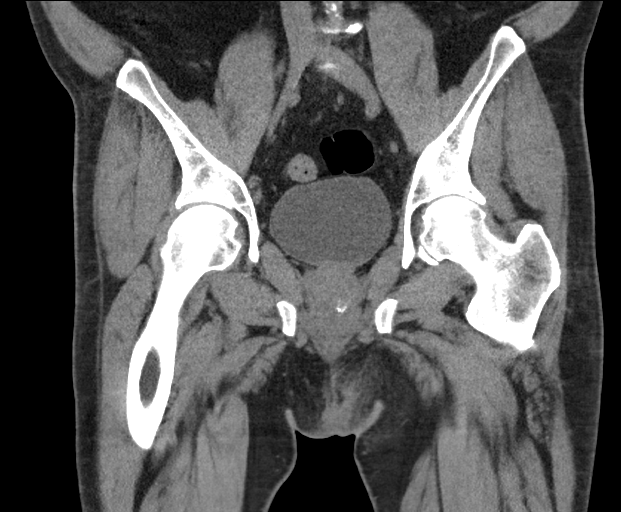

[16 of 46 positions shown; findings below may reference images not displayed]

FINDINGS: Urinary Tract:  The urinary bladder is distended.

Bowel:  Unremarkable visualized pelvic bowel loops.

Vascular/Lymphatic: There are atherosclerotic changes of the
abdominal aorta. There are enlarged pelvic and inguinal lymph nodes,
likely reactive.

Reproductive:  No mass or other significant abnormality

Other: There are inflammatory changes in the patient's left
perineum. There appears to be packing material within the left
gluteal cleft. There is no definite well-formed drainable fluid
collection. There is scrotal wall thickening, left worse than right.

Musculoskeletal: No suspicious bone lesions identified.
IMPRESSION: 1. Inflammatory changes within the left perineum likely representing
a soft tissue infection. There is hyperdense material within the
left gluteal cleft favored to represent packing material, perhaps
from a prior incision and drainage. Correlation with the patient's
history is recommended. At this time, there is no well-formed
drainable fluid collection.
2. Scrotal wall thickening, likely reactive. However, cellulitis can
have a similar appearance and should be correlated with physical
exam.
3. Enlarged inguinal and pelvic lymph nodes, likely reactive.
4.  Aortic Atherosclerosis ([id number]).

## 2021-01-26 IMAGING — DX DG CHEST 1V PORT
1 series · 1 of 1 positions shown · non-contrast
Comparison: Prior radiograph from 02/09/2015.

CLINICAL DATA: Initial evaluation for possible sepsis, fever.
History of CHF.

EXAM:
PORTABLE CHEST 1 VIEW

[chest ap]
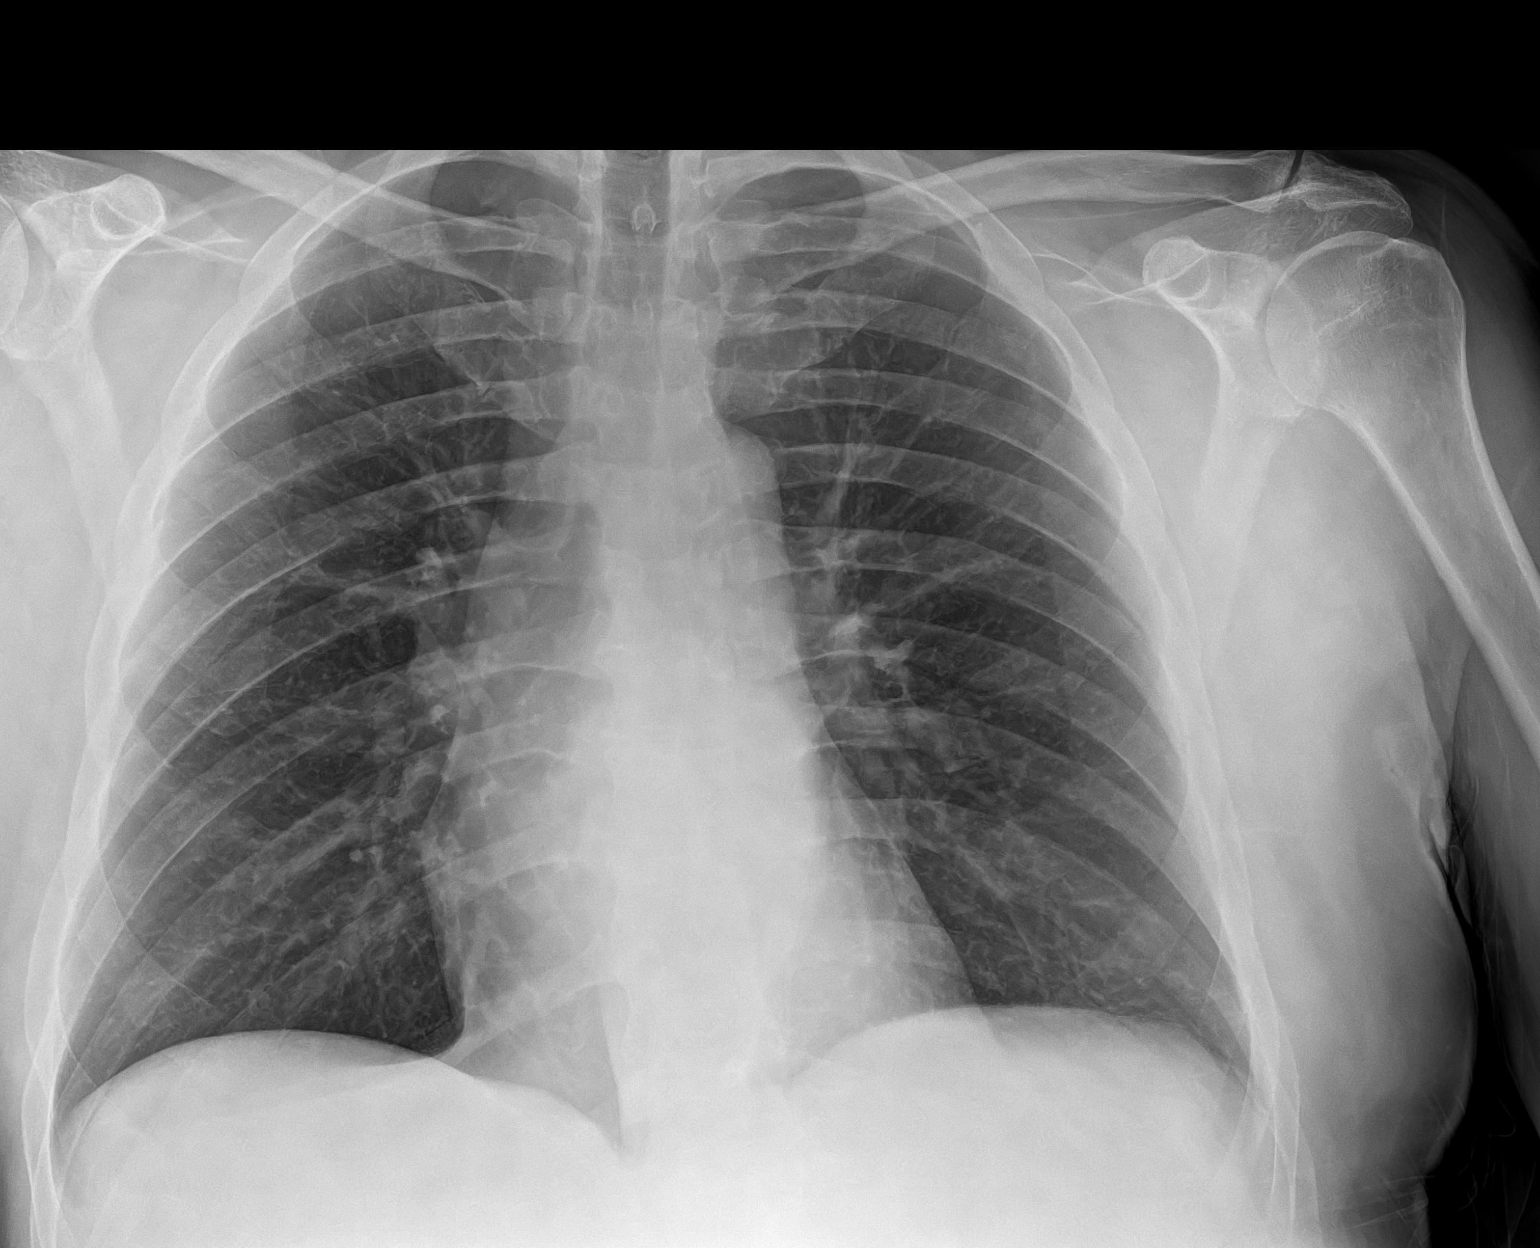

[1 of 1 positions shown; findings below may reference images not displayed]

FINDINGS: Patient is rotated to the right. Cardiac and mediastinal silhouettes
within normal limits.

Lungs normally inflated. Minimal streaky left basilar subsegmental
atelectasis. No focal infiltrates. No edema or effusion. No
pneumothorax.

No acute osseous findings. Postsurgical changes partially visualize
within the cervical spine.
IMPRESSION: Minimal streaky left basilar subsegmental atelectasis. No other
active cardiopulmonary disease identified.

## 2021-03-14 ENCOUNTER — Other Ambulatory Visit: Payer: Self-pay | Admitting: Gastroenterology

## 2021-03-14 DIAGNOSIS — R1084 Generalized abdominal pain: Secondary | ICD-10-CM

## 2021-03-14 DIAGNOSIS — R112 Nausea with vomiting, unspecified: Secondary | ICD-10-CM

## 2021-03-14 DIAGNOSIS — K219 Gastro-esophageal reflux disease without esophagitis: Secondary | ICD-10-CM

## 2021-11-14 ENCOUNTER — Other Ambulatory Visit: Payer: Self-pay | Admitting: Gastroenterology

## 2021-11-14 DIAGNOSIS — K219 Gastro-esophageal reflux disease without esophagitis: Secondary | ICD-10-CM

## 2021-11-14 DIAGNOSIS — R1084 Generalized abdominal pain: Secondary | ICD-10-CM

## 2021-11-14 DIAGNOSIS — R112 Nausea with vomiting, unspecified: Secondary | ICD-10-CM

## 2021-11-17 ENCOUNTER — Encounter: Payer: Self-pay | Admitting: Internal Medicine

## 2021-11-17 NOTE — Telephone Encounter (Signed)
Refilled but needs appointment.  ?

## 2021-11-17 NOTE — Telephone Encounter (Signed)
Last office visit 07/04/20 ?

## 2022-07-16 ENCOUNTER — Other Ambulatory Visit: Payer: Self-pay | Admitting: Gastroenterology

## 2022-07-16 DIAGNOSIS — R1084 Generalized abdominal pain: Secondary | ICD-10-CM

## 2022-07-16 DIAGNOSIS — R112 Nausea with vomiting, unspecified: Secondary | ICD-10-CM

## 2022-07-16 DIAGNOSIS — K219 Gastro-esophageal reflux disease without esophagitis: Secondary | ICD-10-CM

## 2022-12-30 ENCOUNTER — Other Ambulatory Visit (HOSPITAL_BASED_OUTPATIENT_CLINIC_OR_DEPARTMENT_OTHER): Payer: Self-pay

## 2022-12-30 MED ORDER — MOUNJARO 5 MG/0.5ML ~~LOC~~ SOAJ
5.0000 mg | SUBCUTANEOUS | 0 refills | Status: AC
  Filled 2022-12-30: qty 2, 28d supply, fill #0

## 2023-01-01 ENCOUNTER — Other Ambulatory Visit (HOSPITAL_BASED_OUTPATIENT_CLINIC_OR_DEPARTMENT_OTHER): Payer: Self-pay
